# Patient Record
Sex: Female | Born: 1990 | Race: Black or African American | Hispanic: No | Marital: Single | Smoking: Never smoker
Health system: Southern US, Community
[De-identification: ages and names within clinical notes are randomized; demographics above are authoritative.]

## PROBLEM LIST (undated history)

## (undated) ENCOUNTER — Inpatient Hospital Stay (HOSPITAL_COMMUNITY): Payer: Self-pay

## (undated) DIAGNOSIS — S82899A Other fracture of unspecified lower leg, initial encounter for closed fracture: Secondary | ICD-10-CM

## (undated) DIAGNOSIS — B999 Unspecified infectious disease: Secondary | ICD-10-CM

## (undated) DIAGNOSIS — F419 Anxiety disorder, unspecified: Secondary | ICD-10-CM

## (undated) HISTORY — DX: Unspecified infectious disease: B99.9

## (undated) HISTORY — PX: NO PAST SURGERIES: SHX2092

## (undated) HISTORY — DX: Other fracture of unspecified lower leg, initial encounter for closed fracture: S82.899A

## (undated) HISTORY — DX: Anxiety disorder, unspecified: F41.9

---
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
## 2009-10-20 ENCOUNTER — Ambulatory Visit: Payer: Self-pay | Admitting: Internal Medicine

## 2009-10-20 DIAGNOSIS — R259 Unspecified abnormal involuntary movements: Secondary | ICD-10-CM | POA: Insufficient documentation

## 2009-10-20 DIAGNOSIS — F410 Panic disorder [episodic paroxysmal anxiety] without agoraphobia: Secondary | ICD-10-CM

## 2009-10-20 LAB — CONVERTED CEMR LAB: TSH: 1.164 microintl units/mL (ref 0.350–4.500)

## 2009-11-10 ENCOUNTER — Encounter (INDEPENDENT_AMBULATORY_CARE_PROVIDER_SITE_OTHER): Payer: Self-pay | Admitting: Internal Medicine

## 2010-01-19 ENCOUNTER — Ambulatory Visit: Admit: 2010-01-19 | Payer: Self-pay | Admitting: Internal Medicine

## 2010-02-13 NOTE — Assessment & Plan Note (Signed)
Summary: anxiety attacks,hands shake alot/lr   Vital Signs:  Patient profile:   20 year old female Height:      66 inches Weight:      168 pounds BMI:     27.21 Temp:     98.0 degrees F oral Pulse rate:   60 / minute Pulse rhythm:   regular Resp:     16 per minute BP sitting:   110 / 76  (left arm) Cuff size:   regular  Vitals Entered By: [place] Shannon (October 20, 2009 11:03 AM) CC: pt wants to know shoulders ach alot and she wants to [facility]... pt says her hands shake... Is Patient Diabetic? No Pain Assessment Patient in pain? no       Does patient need assistance? Functional Status Self care Ambulation Normal   CC:  pt wants to know shoulders ach alot and she wants to [facility]... pt says her hands shake....  History of Present Illness: 20 yo female here to establish.  1.  Shoulders ache--points to trap area.  Mainly when cooler temperatures or in air conditioning.  States has been a problem for 2 years.  Always a bit tight.  Posture sometimes not great, but generally not bad.  Generally, does not use a shoulder bag--quit some time ago as caused discomfort.  Has never had any treatment for this.  Feels she is stressed daily, so hard for her to say whether that makes worse.  Has not really taken anything otc for the discomfort.  Always feels stiff.  2.  Bilateral hand shaking:  problem since in Junior high.  Only when trying to do something.  No one else in family that she is aware of with same problem.  Cannot say whether when she was on the Fluoxetine that this symptoms was controlled.  3.  Infrequent Panic attacks:  was on Fluoxetine up until 2 years ago for that.   Stopped 2 years ago as ran out of Medicaid.  Has maybe 1-2 episodes monthly--was related more when on bus.  Usually able to self calm.    4.  HM:  had pap 3 months ago.  Received flu vaccine already today.  Current Medications (verified): 1)  Seasonique 0.15-0.03 &0.01 Mg Tabs (Levonorgest-Eth Estrad 91-Day)  .... One Pill Daily  Allergies (verified): No Known Drug Allergies  Family History: Mother, 49:  Healthy Father, died 33:  Anaphylaxis to bee sting. 5 Sisters:  healthy 1 Brother,:  healthy No children.  Social History: LIves with 2 roommates Junior in IT consultant at [employer] Tobacco Use:  Black and Mild on occasion. Alcohol:  occasional. Drug:  MJ--occasionally.  No history of any other drugs Boyfriend--sexually active--uses condoms and is on BCPs.  Physical Exam  General:  NAD Neck:  No deformities, masses, or tenderness noted.no thyromegaly.   Lungs:  Normal respiratory effort, chest expands symmetrically. Lungs are clear to auscultation, no crackles or wheezes.no wheezes.   Heart:  Normal rate and regular rhythm. S1 and S2 normal without gallop, murmur, click, rub or other extra sounds. Msk:  Tender over both trap heads bilaterally Neurologic:  Mild tremulousness with purposeful movement or when trying to hold hands steady.  Not at restgait normal and DTRs symmetrical and normal.     Impression & Recommendations:  Problem # 1:  MUSCLE SPASM, TRAPEZIUS, BILATERAL (ICD-728.85) Discussed warm packing and stretches. Orders: Physical Therapy Referral (PT)  Problem # 2:  TREMOR (ICD-781.0) Discussed beta blockers as possible treatment. Elects to just  follow for now--unlikely a concerning condition with chronicity and little worsening. Orders: T-TSH ([id number])  Problem # 3:  PANIC DISORDER (ICD-300.01) Able to control well currently without medication.   Hold on medication for now.  Complete Medication List: 1)  Seasonique 0.15-0.03 &0.01 Mg Tabs (Levonorgest-eth estrad 91-day) .... One pill daily  Other Orders: Flu Vaccine 61yrs + (14782) Admin 1st Vaccine (95621) Admin 1st Vaccine [facility]) [phone])  Patient Instructions: 1)  Follow up with Dr. Delrae Alfred in 3 months --trapezius pain 2)  Call if you do not hear from physical therapy in 2  weeks.     Influenza Vaccine    Vaccine Type: Fluvax 3+    Site: left deltoid    Mfr: GlaxoSmithKline    Dose: 0.5 ml    Route: IM    Given by: Armenia Shannon    Exp. Date: 06/2010    Lot #: [id number]    VIS given: 08/08/09 version given October 20, 2009.  Flu Vaccine Consent Questions    Do you have a history of severe allergic reactions to this vaccine? no    Any prior history of allergic reactions to egg and/or gelatin? no    Do you have a sensitivity to the preservative Thimersol? no    Do you have a past history of Guillan-Barre Syndrome? no    Do you currently have an acute febrile illness? no    Have you ever had a severe reaction to latex? no    Vaccine information given and explained to patient? yes    Are you currently pregnant? no

## 2010-02-13 NOTE — Letter (Signed)
Summary: *HSN Results Follow up  [facility]  [address] [place], [place] [address]   Phone: [phone]  Fax: [phone]      11/10/2009   Kathleen Sparks [address] [place], [place]  [address]   Dear  Ms. Kathleen Sparks,                            ____S.Drinkard,FNP   ____D. Gore,FNP       ____B. McPherson,MD   ____V. Rankins,MD    __X__E. Mulberry,MD    ____N. Daphine Deutscher, FNP  ____D. Reche Dixon, MD    ____K. Philipp Deputy, MD    ____Other     This letter is to inform you that your recent test(s):  _______Pap Smear    ____X___Lab Test     _______X-ray    __X_____ is within acceptable limits  _______ requires a medication change  _______ requires a follow-up lab visit  _______ requires a follow-up visit with your provider   Comments:  Thyroid testing was normal       _________________________________________________________ If you have any questions, please contact our office                     Sincerely,  Kathleen Manson MD [facility]

## 2010-09-20 ENCOUNTER — Emergency Department (HOSPITAL_COMMUNITY)
Admission: EM | Admit: 2010-09-20 | Discharge: 2010-09-20 | Disposition: A | Discharge status: Home / Self Care | Payer: No Typology Code available for payment source | Attending: Emergency Medicine | Admitting: Emergency Medicine

## 2010-09-20 DIAGNOSIS — R0989 Other specified symptoms and signs involving the circulatory and respiratory systems: Secondary | ICD-10-CM | POA: Insufficient documentation

## 2010-09-20 DIAGNOSIS — M25519 Pain in unspecified shoulder: Secondary | ICD-10-CM | POA: Insufficient documentation

## 2010-09-20 DIAGNOSIS — R0609 Other forms of dyspnea: Secondary | ICD-10-CM | POA: Insufficient documentation

## 2010-09-20 DIAGNOSIS — M542 Cervicalgia: Secondary | ICD-10-CM | POA: Insufficient documentation

## 2010-09-20 DIAGNOSIS — R0602 Shortness of breath: Secondary | ICD-10-CM | POA: Insufficient documentation

## 2012-01-15 NOTE — L&D Delivery Note (Signed)
Delivery Note At 10:29 PM a viable female was delivered via  (Presentation:LOA ).  Loose nuchal cord reduced right after delivery of head.  True knot noted after delivery.  APGAR: 9,9 ; weight pending.   Placenta status: Intact, Spontaneous.  Cord:  with the following complications: .  Cord pH: not collect  Anesthesia: Epidural  Episiotomy:  Lacerations: Periurethral, hemostatic, without repair. Suture Repair: n/a Est. Blood Loss (mL): 100  Mom to postpartum.  Baby to skin to skin immediately after delivery.  Haroldine Laws 09/16/2012, 10:46 PM

## 2012-01-22 ENCOUNTER — Ambulatory Visit: Payer: BC Managed Care – PPO | Admitting: Physician Assistant

## 2012-01-22 VITALS — BP 126/84 | HR 70 | Temp 98.7°F | Resp 16 | Ht 67.0 in | Wt 176.2 lb

## 2012-01-22 DIAGNOSIS — N912 Amenorrhea, unspecified: Secondary | ICD-10-CM

## 2012-01-22 DIAGNOSIS — Z3200 Encounter for pregnancy test, result unknown: Secondary | ICD-10-CM

## 2012-01-22 LAB — POCT URINE PREGNANCY: Preg Test, Ur: POSITIVE

## 2012-01-22 MED ORDER — PRENATAL VITAMINS 28-0.8 MG PO TABS: 1.0000 | ORAL_TABLET | Freq: Every day | ORAL | Status: DC

## 2012-01-22 NOTE — Progress Notes (Signed)
  Subjective:    Patient ID: Kathleen Sparks, female    DOB: [birth year], 22 y.o.   MRN: [id number]  HPI 22 year old female presents for pregnancy test. LNMP 12/12/11.  She has had abdominal cramping over the past few days, but denies bleeding/spotting, or pain.  She has not had any nausea or vomiting yet.  No current daily medications. She is otherwise healthy without any other complaints today.     Review of Systems  Constitutional: Negative for fever and chills.  Gastrointestinal: Positive for abdominal pain (cramping). Negative for nausea and vomiting.  Skin: Negative for rash.  Neurological: Negative for headaches.       Objective:   Physical Exam  Constitutional: She is oriented to person, place, and time. She appears well-developed and well-nourished.  HENT:  Head: Normocephalic and atraumatic.  Right Ear: External ear normal.  Eyes: Conjunctivae normal are normal.  Neck: Normal range of motion.  Cardiovascular: Normal rate, regular rhythm and normal heart sounds.   Pulmonary/Chest: Effort normal and breath sounds normal.  Abdominal: Soft. Bowel sounds are normal. There is no tenderness. There is no rebound and no guarding.  Neurological: She is alert and oriented to person, place, and time.  Psychiatric: She has a normal mood and affect. Her behavior is normal. Judgment and thought content normal.     Results for orders placed in visit on 01/22/12  POCT URINE PREGNANCY      Component Value Range   Preg Test, Ur Positive          Assessment & Plan:   1. Amenorrhea    2. Possible pregnancy  POCT urine pregnancy   Labs printed  Follow up with Obstetrics Start prenatal vitamins Continue folic acid Follow up as needed. RTC or go to ER with any worsening abdominal pain, cramping, or bleeding.

## 2012-01-22 NOTE — Patient Instructions (Addendum)
LNMP 12/12/11 - Estimated due date is 09/17/12

## 2012-03-06 ENCOUNTER — Ambulatory Visit: Payer: Medicaid Other | Admitting: Obstetrics and Gynecology

## 2012-03-06 DIAGNOSIS — Z331 Pregnant state, incidental: Secondary | ICD-10-CM

## 2012-03-06 DIAGNOSIS — Z36 Encounter for antenatal screening of mother: Secondary | ICD-10-CM

## 2012-03-06 LAB — POCT URINALYSIS DIPSTICK
Bilirubin, UA: NEGATIVE
Blood, UA: NEGATIVE
Glucose, UA: NEGATIVE
Ketones, UA: NEGATIVE
Nitrite, UA: NEGATIVE

## 2012-03-06 NOTE — Progress Notes (Signed)
NOB interview. NOB W/U with JO scheduled 03/13/12.  !st trimesters screen scheduled 03/16/12.

## 2012-03-07 LAB — CULTURE, OB URINE: Colony Count: 75000

## 2012-03-09 LAB — PRENATAL PANEL VII
Basophils Absolute: 0 10*3/uL (ref 0.0–0.1)
Basophils Relative: 0 % (ref 0–1)
Eosinophils Absolute: 0.1 10*3/uL (ref 0.0–0.7)
HIV: NONREACTIVE
Hemoglobin: 13.2 g/dL (ref 12.0–15.0)
Hepatitis B Surface Ag: NEGATIVE
MCH: 29.5 pg (ref 26.0–34.0)
MCHC: 34.4 g/dL (ref 30.0–36.0)
Monocytes Relative: 7 % (ref 3–12)
Neutro Abs: 9.6 10*3/uL — ABNORMAL HIGH (ref 1.7–7.7)
Neutrophils Relative %: 79 % — ABNORMAL HIGH (ref 43–77)
Platelets: 217 10*3/uL (ref 150–400)
RDW: 13.4 % (ref 11.5–15.5)

## 2012-03-09 LAB — GC/CHLAMYDIA PROBE AMP, URINE: GC Probe Amp, Urine: NEGATIVE

## 2012-03-10 LAB — HEMOGLOBINOPATHY EVALUATION
Hemoglobin Other: 0 %
Hgb A2 Quant: 2.9 % (ref 2.2–3.2)
Hgb A: 97.1 % (ref 96.8–97.8)
Hgb F Quant: 0 % (ref 0.0–2.0)

## 2012-03-13 ENCOUNTER — Encounter: Payer: Self-pay | Admitting: Certified Nurse Midwife

## 2012-03-13 ENCOUNTER — Ambulatory Visit: Payer: Medicaid Other | Admitting: Certified Nurse Midwife

## 2012-03-13 VITALS — BP 120/68 | Temp 98.5°F | Wt 172.0 lb

## 2012-03-13 DIAGNOSIS — Z331 Pregnant state, incidental: Secondary | ICD-10-CM

## 2012-03-13 MED ORDER — METRONIDAZOLE 0.75 % VA GEL: 1.0000 | Freq: Two times a day (BID) | VAGINAL | Status: DC

## 2012-03-13 NOTE — Progress Notes (Signed)
New ob work up No complaints LMP 12-12-2011 Last pap2012

## 2012-03-13 NOTE — Progress Notes (Signed)
   S NKDA  Due for Pap Smear  New OB WU  C/O vaginal d/c with intermittent pruritis O Thyroid - wnl, no masses or tenderness  Heart- RRR without Murmur  Lungs- C to A  Back- Nl curvature      No CVAT  Abd.: Soft and non tender            No RBT  Pelvic:Uterus enlarged to 13 wk size and slightly R/V   FHT's 134-150 and regular   Adnexa- non tender     Wet Prep:  Clue cells, Ph 5., Whiff pos  Plan:        metrogel                     Cont. PNV          Balanced diet                     Gc/chl and pap done  Plan:   having first tri screen March 2014   Pt. With supportive partner, present   Flagyl 500 mgs bid for BV   Balanced diet reviewed   Bleeding precautions reviewed   Call or follow up prn and for next [place] screen

## 2012-03-14 LAB — GC/CHLAMYDIA PROBE AMP
CT Probe RNA: NEGATIVE
GC Probe RNA: NEGATIVE

## 2012-03-15 LAB — URINE CULTURE

## 2012-03-16 ENCOUNTER — Ambulatory Visit: Payer: Medicaid Other

## 2012-03-16 ENCOUNTER — Other Ambulatory Visit: Payer: Self-pay

## 2012-03-16 DIAGNOSIS — Z36 Encounter for antenatal screening of mother: Secondary | ICD-10-CM

## 2012-03-16 LAB — US OB COMP LESS 14 WKS

## 2012-03-16 LAB — PAP IG W/ RFLX HPV ASCU

## 2012-04-15 ENCOUNTER — Encounter: Payer: Medicaid Other | Admitting: Obstetrics and Gynecology

## 2012-08-26 LAB — OB RESULTS CONSOLE GBS: GBS: POSITIVE

## 2012-09-16 ENCOUNTER — Encounter (HOSPITAL_COMMUNITY): Payer: Self-pay | Admitting: Anesthesiology

## 2012-09-16 ENCOUNTER — Inpatient Hospital Stay (HOSPITAL_COMMUNITY): Payer: Medicaid Other | Admitting: Anesthesiology

## 2012-09-16 ENCOUNTER — Inpatient Hospital Stay (HOSPITAL_COMMUNITY)
Admission: AD | Admit: 2012-09-16 | Discharge: 2012-09-18 | DRG: 774 | Disposition: A | Discharge status: Home / Self Care | Payer: Medicaid Other | Source: Ambulatory Visit | Attending: Obstetrics and Gynecology | Admitting: Obstetrics and Gynecology

## 2012-09-16 ENCOUNTER — Encounter (HOSPITAL_COMMUNITY): Payer: Self-pay | Admitting: *Deleted

## 2012-09-16 DIAGNOSIS — O99892 Other specified diseases and conditions complicating childbirth: Secondary | ICD-10-CM | POA: Diagnosis present

## 2012-09-16 DIAGNOSIS — A5619 Other chlamydial genitourinary infection: Secondary | ICD-10-CM | POA: Diagnosis present

## 2012-09-16 DIAGNOSIS — N739 Female pelvic inflammatory disease, unspecified: Secondary | ICD-10-CM | POA: Diagnosis present

## 2012-09-16 DIAGNOSIS — Z22330 Carrier of Group B streptococcus: Secondary | ICD-10-CM

## 2012-09-16 DIAGNOSIS — N390 Urinary tract infection, site not specified: Secondary | ICD-10-CM | POA: Diagnosis present

## 2012-09-16 DIAGNOSIS — O26849 Uterine size-date discrepancy, unspecified trimester: Secondary | ICD-10-CM | POA: Insufficient documentation

## 2012-09-16 DIAGNOSIS — O2390 Unspecified genitourinary tract infection in pregnancy, unspecified trimester: Secondary | ICD-10-CM | POA: Diagnosis present

## 2012-09-16 DIAGNOSIS — O98319 Other infections with a predominantly sexual mode of transmission complicating pregnancy, unspecified trimester: Secondary | ICD-10-CM | POA: Diagnosis present

## 2012-09-16 DIAGNOSIS — A749 Chlamydial infection, unspecified: Secondary | ICD-10-CM | POA: Insufficient documentation

## 2012-09-16 DIAGNOSIS — B3731 Acute candidiasis of vulva and vagina: Secondary | ICD-10-CM | POA: Insufficient documentation

## 2012-09-16 DIAGNOSIS — B373 Candidiasis of vulva and vagina: Secondary | ICD-10-CM | POA: Diagnosis present

## 2012-09-16 DIAGNOSIS — IMO0001 Reserved for inherently not codable concepts without codable children: Secondary | ICD-10-CM

## 2012-09-16 LAB — RPR: RPR Ser Ql: NONREACTIVE

## 2012-09-16 LAB — CBC
MCV: 88.4 fL (ref 78.0–100.0)
Platelets: 177 10*3/uL (ref 150–400)
RDW: 13.4 % (ref 11.5–15.5)
WBC: 14.6 10*3/uL — ABNORMAL HIGH (ref 4.0–10.5)

## 2012-09-16 MED ORDER — FENTANYL 2.5 MCG/ML BUPIVACAINE 1/10 % EPIDURAL INFUSION (WH - ANES)
14.0000 mL/h | INTRAMUSCULAR | Status: DC | PRN
  Administered 2012-09-16: 14 mL/h via EPIDURAL
  Filled 2012-09-16 (×2): qty 125

## 2012-09-16 MED ORDER — IBUPROFEN 600 MG PO TABS: 600.0000 mg | ORAL_TABLET | Freq: Four times a day (QID) | ORAL | Status: DC | PRN

## 2012-09-16 MED ORDER — ACETAMINOPHEN 325 MG PO TABS: 650.0000 mg | ORAL_TABLET | ORAL | Status: DC | PRN

## 2012-09-16 MED ORDER — EPHEDRINE 5 MG/ML INJ
10.0000 mg | INTRAVENOUS | Status: DC | PRN
  Filled 2012-09-16: qty 2

## 2012-09-16 MED ORDER — LIDOCAINE HCL (PF) 1 % IJ SOLN
INTRAMUSCULAR | Status: DC | PRN
  Administered 2012-09-16 (×2): 4 mL

## 2012-09-16 MED ORDER — DIPHENHYDRAMINE HCL 50 MG/ML IJ SOLN: 12.5000 mg | INTRAMUSCULAR | Status: DC | PRN

## 2012-09-16 MED ORDER — EPHEDRINE 5 MG/ML INJ
10.0000 mg | INTRAVENOUS | Status: DC | PRN
  Filled 2012-09-16: qty 4
  Filled 2012-09-16: qty 2

## 2012-09-16 MED ORDER — LACTATED RINGERS IV SOLN
INTRAVENOUS | Status: DC
  Administered 2012-09-16 (×2): via INTRAVENOUS

## 2012-09-16 MED ORDER — CITRIC ACID-SODIUM CITRATE 334-500 MG/5ML PO SOLN: 30.0000 mL | ORAL | Status: DC | PRN

## 2012-09-16 MED ORDER — LACTATED RINGERS IV SOLN: 500.0000 mL | INTRAVENOUS | Status: DC | PRN

## 2012-09-16 MED ORDER — DEXTROSE 5 % IV SOLN
5.0000 10*6.[IU] | Freq: Once | INTRAVENOUS | Status: AC
  Administered 2012-09-16: 5 10*6.[IU] via INTRAVENOUS
  Filled 2012-09-16: qty 5

## 2012-09-16 MED ORDER — OXYTOCIN BOLUS FROM INFUSION: 500.0000 mL | INTRAVENOUS | Status: DC

## 2012-09-16 MED ORDER — OXYTOCIN 40 UNITS IN LACTATED RINGERS INFUSION - SIMPLE MED
62.5000 mL/h | INTRAVENOUS | Status: DC
  Administered 2012-09-16: 62.5 mL/h via INTRAVENOUS
  Filled 2012-09-16: qty 1000

## 2012-09-16 MED ORDER — PENICILLIN G POTASSIUM 5000000 UNITS IJ SOLR
2.5000 10*6.[IU] | INTRAVENOUS | Status: DC
  Administered 2012-09-16 (×3): 2.5 10*6.[IU] via INTRAVENOUS
  Filled 2012-09-16 (×8): qty 2.5

## 2012-09-16 MED ORDER — OXYCODONE-ACETAMINOPHEN 5-325 MG PO TABS: 1.0000 | ORAL_TABLET | ORAL | Status: DC | PRN

## 2012-09-16 MED ORDER — PHENYLEPHRINE 40 MCG/ML (10ML) SYRINGE FOR IV PUSH (FOR BLOOD PRESSURE SUPPORT)
80.0000 ug | PREFILLED_SYRINGE | INTRAVENOUS | Status: DC | PRN
  Filled 2012-09-16: qty 2
  Filled 2012-09-16: qty 5

## 2012-09-16 MED ORDER — ONDANSETRON HCL 4 MG/2ML IJ SOLN: 4.0000 mg | Freq: Four times a day (QID) | INTRAMUSCULAR | Status: DC | PRN

## 2012-09-16 MED ORDER — LACTATED RINGERS IV SOLN
500.0000 mL | Freq: Once | INTRAVENOUS | Status: AC
  Administered 2012-09-16: 500 mL via INTRAVENOUS

## 2012-09-16 MED ORDER — LIDOCAINE HCL (PF) 1 % IJ SOLN
30.0000 mL | INTRAMUSCULAR | Status: DC | PRN
  Filled 2012-09-16 (×2): qty 30

## 2012-09-16 MED ORDER — FENTANYL 2.5 MCG/ML BUPIVACAINE 1/10 % EPIDURAL INFUSION (WH - ANES)
INTRAMUSCULAR | Status: DC | PRN
  Administered 2012-09-16: 14 mL/h via EPIDURAL

## 2012-09-16 MED ORDER — PHENYLEPHRINE 40 MCG/ML (10ML) SYRINGE FOR IV PUSH (FOR BLOOD PRESSURE SUPPORT)
80.0000 ug | PREFILLED_SYRINGE | INTRAVENOUS | Status: DC | PRN
  Filled 2012-09-16: qty 2

## 2012-09-16 NOTE — Progress Notes (Signed)
  Subjective: Pt resting comfortably watching tv.  Pt reports feeling intermittent rectal pressure.  Objective: BP 109/92  Pulse 86  Temp(Src) 98.6 F (37 C) (Oral)  Resp 18  Ht 5\' 6"  (1.676 m)  Wt 200 lb 9.6 oz (90.992 kg)  BMI 32.39 kg/m2  SpO2 100%  LMP 12/12/2011 I/O last 3 completed shifts: In: -  Out: 1200 [Urine:1200]    FHT:  FHR: 145 bpm, variability: moderate,  accelerations:  Present,  decelerations:  Present occasional variables UC:   regular, every 2-4 minutes  SVE:   Deferred  Assessment / Plan:  Labor: Active labor; Progressing normally  Preeclampsia: no signs or symptoms of toxicity  Fetal Wellbeing: Category I  Pain Control: Epidural  I/D: GBS pos; PCN per protocol; AROM at 1130; afibrile Anticipated MOD: NSVD     Lou Irigoyen 09/16/2012, 8:06 PM

## 2012-09-16 NOTE — H&P (Signed)
Kathleen Sparks is a 22 y.o. female, G1P0000 at [redacted]w[redacted]d, presenting for active labor.  UCs since 2 am.  Denies VB, LOF, recent fever, resp or GI c/o's, UTI or PIH s/s. GFM. Desires epidural.  Patient Active Problem List   Diagnosis Date Noted  . Chlamydia infection 09/16/2012  . Candidal vaginitis 09/16/2012  . UTI (urinary tract infection) 09/16/2012  . Uterine size date discrepancy 09/16/2012  . PANIC DISORDER 10/20/2009  . TREMOR 10/20/2009    History of present pregnancy: Patient entered care at 12 weeks.   EDC of 09/17/12 was established by LMP.   Anatomy scan:  19 weeks, limited with normal findings and an anterior placenta.   Additional [place] evaluations:  [redacted]w[redacted]d Anatomy f/u - anatomy completed, AFI normal, EFW 29th%ile.  [redacted]w[redacted]d for growth and BPP - EFW 11th%ile, 8/8, fluid normal Significant prenatal events:  none   Last evaluation:  09/15/12 at [redacted]w[redacted]d no cervical exam  OB History   Grav Para Term Preterm Abortions TAB SAB Ect Mult Living   1 0 0 0 0 0 0 0 0 0      Past Medical History  Diagnosis Date  . Anxiety 2008    SHORT TERM MED  . Infection 2009    UTI  . Infection 2011    CHLAMYDIA  . Ankle fracture 2001   No past surgical history on file. Family History: family history includes Alcohol abuse in her maternal uncle; Anemia in her mother; Arthritis in her paternal grandmother; Asthma in her other; Cancer in her maternal uncle; Depression in her mother; Early death (age of onset: 64) in her father; Hypertension in her mother; Learning disabilities in her maternal uncle. Social History:  reports that she has never smoked. She has never used smokeless tobacco. She reports that  drinks alcohol. She reports that she uses illicit drugs.   Prenatal Transfer Tool  Maternal Diabetes: No Genetic Screening: Normal Maternal Ultrasounds/Referrals: Normal Fetal Ultrasounds or other Referrals:  None Maternal Substance Abuse:  No Significant Maternal Medications:  None Significant  Maternal Lab Results: Lab values include: Group B Strep positive    ROS: see HPI above, all other systems are negative  No Known Allergies   Dilation: 4 Effacement (%): 90 Station: -2 Exam by:: D Nelson RN Blood pressure 128/84, pulse 87, temperature 98.4 F (36.9 C), temperature source Oral, resp. rate 20, height 5\' 6"  (1.676 m), weight 200 lb 9.6 oz (90.992 kg), last menstrual period 12/12/2011.  Chest clear Heart RRR without murmur Abd gravid, NT Ext: WNL  FHR: Reactive NST UCs:  Q 1.5 - 3 min  Prenatal labs: ABO, Rh: O/POS/-- (02/21 1136) Antibody: NEG (02/21 1136) Rubella:   Immune RPR: NON REAC (02/21 1136)  HBsAg: NEGATIVE (02/21 1136)  HIV: NON REACTIVE (02/21 1136)  GBS:  Pos Sickle cell/Hgb electrophoresis:  Normal study Pap:  03/13/12 WNL GC:  Neg Chlamydia:  Neg Genetic screenings:  1st trimester screen - normal Glucola:  69 Other:  n/a   Assessment/Plan: IUP at [redacted]w[redacted]d Active labor GBS pos Desires epidural  Admit to BS per c/w Dr. Richardson Dopp as attending MD Routine [facility] admission orders GBS prophylaxis PCN per protocol Epidural prn   Rowan Blase, MSN 09/16/2012, 6:13 AM

## 2012-09-16 NOTE — Progress Notes (Signed)
To room 166

## 2012-09-16 NOTE — Progress Notes (Signed)
Kathleen Sparks is a 22 y.o. G1P0000 at [redacted]w[redacted]d by LMP admitted for active labor  Subjective:   Objective: BP 114/63  Pulse 78  Temp(Src) 98.8 F (37.1 C) (Axillary)  Resp 18  Ht 5\' 6"  (1.676 m)  Wt 200 lb 9.6 oz (90.992 kg)  BMI 32.39 kg/m2  SpO2 100%  LMP 12/12/2011      FHT:  FHR: 150 bpm, variability: marked,  accelerations:  Present,  decelerations:  Present occ variable to 100s for 10-20 seconds UC:   regular, every 2-4 minutes SVE:   Dilation: 4.5 Effacement (%): 90 Station: -1 Exam by:: Adolphe Fortunato, MD  Labs: Lab Results  Component Value Date   WBC 14.6* 09/16/2012   HGB 12.8 09/16/2012   HCT 37.2 09/16/2012   MCV 88.4 09/16/2012   PLT 177 09/16/2012    Assessment / Plan: no cervical change after 2 hrs.  AROM clear fluid.  If no cervical chge in 2 hrs plan pitocin  Labor: Progressing normally Preeclampsia:  no signs or symptoms of toxicity Fetal Wellbeing:  Category I Pain Control:  Epidural I/D:  n/a Anticipated MOD:  NSVD PCN for GBS  Kathleen Sparks A 09/16/2012, 1:07 PM

## 2012-09-16 NOTE — MAU Note (Signed)
Patient states she starting have uc's at around 2 AM. Denies LOF, bleeding, or discharge.

## 2012-09-16 NOTE — Anesthesia Procedure Notes (Signed)
Epidural Patient location during procedure: OB Start time: 09/16/2012 8:15 AM  Staffing Anesthesiologist: Kule Gascoigne A. Performed by: anesthesiologist   Preanesthetic Checklist Completed: patient identified, site marked, surgical consent, pre-op evaluation, timeout performed, IV checked, risks and benefits discussed and monitors and equipment checked  Epidural Patient position: sitting Prep: site prepped and draped and DuraPrep Patient monitoring: continuous pulse ox and blood pressure Approach: midline Injection technique: LOR air  Needle:  Needle type: Tuohy  Needle gauge: 17 G Needle length: 9 cm and 9 Needle insertion depth: 6 cm Catheter type: closed end flexible Catheter size: 19 Gauge Catheter at skin depth: 11 cm Test dose: negative and Other  Assessment Events: blood not aspirated, injection not painful, no injection resistance, negative IV test and no paresthesia  Additional Notes Patient identified. Risks and benefits discussed including failed block, incomplete  Pain control, post dural puncture headache, nerve damage, paralysis, blood pressure Changes, nausea, vomiting, reactions to medications-both toxic and allergic and post Partum back pain. All questions were answered. Patient expressed understanding and wished to proceed. Sterile technique was used throughout procedure. Epidural site was Dressed with sterile barrier dressing. No paresthesias, signs of intravascular injection Or signs of intrathecal spread were encountered.  Patient was more comfortable after the epidural was dosed. Please see RN's note for documentation of vital signs and FHR which are stable.

## 2012-09-16 NOTE — Anesthesia Preprocedure Evaluation (Signed)
Anesthesia Evaluation  Patient identified by MRN, date of birth, ID band Patient awake    Reviewed: Allergy & Precautions, H&P , Patient's Chart, lab work & pertinent test results  Airway Mallampati: II TM Distance: >3 FB Neck ROM: Full    Dental no notable dental hx. (+) Teeth Intact   Pulmonary neg pulmonary ROS,  breath sounds clear to auscultation  Pulmonary exam normal       Cardiovascular negative cardio ROS  Rhythm:Regular Rate:Normal     Neuro/Psych PSYCHIATRIC DISORDERS Anxiety Panic Attacksnegative neurological ROS     GI/Hepatic negative GI ROS, Neg liver ROS,   Endo/Other  negative endocrine ROS  Renal/GU negative Renal ROS  negative genitourinary   Musculoskeletal negative musculoskeletal ROS (+)   Abdominal (+) + obese,   Peds  Hematology negative hematology ROS (+)   Anesthesia Other Findings   Reproductive/Obstetrics (+) Pregnancy                           Anesthesia Physical Anesthesia Plan  ASA: II  Anesthesia Plan: Epidural   Post-op Pain Management:    Induction:   Airway Management Planned: Natural Airway  Additional Equipment:   Intra-op Plan:   Post-operative Plan:   Informed Consent: I have reviewed the patients History and Physical, chart, labs and discussed the procedure including the risks, benefits and alternatives for the proposed anesthesia with the patient or authorized representative who has indicated his/her understanding and acceptance.   Dental advisory given  Plan Discussed with: Anesthesiologist  Anesthesia Plan Comments:         Anesthesia Hausner Evaluation

## 2012-09-17 LAB — CBC
HCT: 35.6 % — ABNORMAL LOW (ref 36.0–46.0)
Hemoglobin: 12 g/dL (ref 12.0–15.0)
MCHC: 33.7 g/dL (ref 30.0–36.0)
RBC: 4.02 MIL/uL (ref 3.87–5.11)
WBC: 17.1 10*3/uL — ABNORMAL HIGH (ref 4.0–10.5)

## 2012-09-17 MED ORDER — SIMETHICONE 80 MG PO CHEW: 80.0000 mg | CHEWABLE_TABLET | ORAL | Status: DC | PRN

## 2012-09-17 MED ORDER — ONDANSETRON HCL 4 MG PO TABS: 4.0000 mg | ORAL_TABLET | ORAL | Status: DC | PRN

## 2012-09-17 MED ORDER — BENZOCAINE-MENTHOL 20-0.5 % EX AERO: 1.0000 "application " | INHALATION_SPRAY | CUTANEOUS | Status: DC | PRN

## 2012-09-17 MED ORDER — ONDANSETRON HCL 4 MG/2ML IJ SOLN: 4.0000 mg | INTRAMUSCULAR | Status: DC | PRN

## 2012-09-17 MED ORDER — TETANUS-DIPHTH-ACELL PERTUSSIS 5-2.5-18.5 LF-MCG/0.5 IM SUSP
0.5000 mL | Freq: Once | INTRAMUSCULAR | Status: AC
  Administered 2012-09-17: 0.5 mL via INTRAMUSCULAR
  Filled 2012-09-17: qty 0.5

## 2012-09-17 MED ORDER — PRENATAL MULTIVITAMIN CH
1.0000 | ORAL_TABLET | Freq: Every day | ORAL | Status: DC
  Administered 2012-09-17: 1 via ORAL
  Filled 2012-09-17: qty 1

## 2012-09-17 MED ORDER — DIPHENHYDRAMINE HCL 25 MG PO CAPS: 25.0000 mg | ORAL_CAPSULE | Freq: Four times a day (QID) | ORAL | Status: DC | PRN

## 2012-09-17 MED ORDER — DIBUCAINE 1 % RE OINT: 1.0000 "application " | TOPICAL_OINTMENT | RECTAL | Status: DC | PRN

## 2012-09-17 MED ORDER — SENNOSIDES-DOCUSATE SODIUM 8.6-50 MG PO TABS
2.0000 | ORAL_TABLET | Freq: Every day | ORAL | Status: DC
  Administered 2012-09-17: 2 via ORAL

## 2012-09-17 MED ORDER — WITCH HAZEL-GLYCERIN EX PADS: 1.0000 "application " | MEDICATED_PAD | CUTANEOUS | Status: DC | PRN

## 2012-09-17 MED ORDER — ZOLPIDEM TARTRATE 5 MG PO TABS: 5.0000 mg | ORAL_TABLET | Freq: Every evening | ORAL | Status: DC | PRN

## 2012-09-17 MED ORDER — OXYCODONE-ACETAMINOPHEN 5-325 MG PO TABS
1.0000 | ORAL_TABLET | ORAL | Status: DC | PRN
  Administered 2012-09-17 – 2012-09-18 (×4): 1 via ORAL
  Filled 2012-09-17 (×4): qty 1

## 2012-09-17 MED ORDER — IBUPROFEN 600 MG PO TABS
600.0000 mg | ORAL_TABLET | Freq: Four times a day (QID) | ORAL | Status: DC
  Administered 2012-09-17 – 2012-09-18 (×6): 600 mg via ORAL
  Filled 2012-09-17 (×8): qty 1

## 2012-09-17 MED ORDER — LANOLIN HYDROUS EX OINT: TOPICAL_OINTMENT | CUTANEOUS | Status: DC | PRN

## 2012-09-17 NOTE — Anesthesia Postprocedure Evaluation (Signed)
Anesthesia Post Note  Patient: Kathleen Sparks  Procedure(s) Performed: * No procedures listed *  Anesthesia type: Epidural  Patient location: Mother/Baby  Post pain: Pain level controlled  Post assessment: Post-op Vital signs reviewed  Last Vitals:  Filed Vitals:   09/17/12 0701  BP: 122/65  Pulse: 76  Temp: 36.8 C  Resp: 18    Post vital signs: Reviewed  Level of consciousness: awake  Complications: No apparent anesthesia complications

## 2012-09-17 NOTE — Progress Notes (Signed)
Post Partum Day 1 Subjective: no complaints, up ad lib, voiding and tolerating PO  Objective: Blood pressure 122/65, pulse 76, temperature 98.3 F (36.8 C), temperature source Oral, resp. rate 18, height 5\' 6"  (1.676 m), weight 200 lb 9.6 oz (90.992 kg), last menstrual period 12/12/2011, SpO2 100.00%, unknown if currently breastfeeding.  Physical Exam:  General: alert and cooperative Lochia: appropriate Uterine Fundus: firm Incision: na DVT Evaluation: No evidence of DVT seen on physical exam.   Recent Labs  09/16/12 0615 09/17/12 0605  HGB 12.8 12.0  HCT 37.2 35.6*    Assessment/Plan: Plan for discharge tomorrow   LOS: 1 day   Roselina Burgueno A 09/17/2012, 11:33 AM

## 2012-09-18 MED ORDER — IBUPROFEN 600 MG PO TABS: 600.0000 mg | ORAL_TABLET | Freq: Four times a day (QID) | ORAL | Status: DC

## 2012-09-18 MED ORDER — OXYCODONE-ACETAMINOPHEN 5-325 MG PO TABS: 1.0000 | ORAL_TABLET | ORAL | Status: DC | PRN

## 2012-09-18 MED ORDER — MEDROXYPROGESTERONE ACETATE 150 MG/ML IM SUSP
150.0000 mg | Freq: Once | INTRAMUSCULAR | Status: AC
  Administered 2012-09-18: 150 mg via INTRAMUSCULAR
  Filled 2012-09-18: qty 1

## 2012-09-18 NOTE — Lactation Note (Signed)
This note was copied from the chart of Kathleen Sparks. Lactation Consultation Note  Baby has been breastfeeding using a nipple shield. RN assisted mother with latching without the shield and baby is latched and feeding well on this visit. Mother's breast are filling and colostrum is easily expressed. Mother is very motivated and making great progression. Ped appointment on 09/19/12. Informed of lactation services and support groups.   Patient Name: Kathleen Sparks [id number] Date: 09/18/2012 Reason for consult: Follow-up assessment   Maternal Data Has patient been taught Hand Expression?: Yes  Feeding Feeding Type: Breast Milk Length of feed: 30 min  LATCH Score/Interventions Latch: Grasps breast easily, tongue down, lips flanged, rhythmical sucking.  Audible Swallowing: A few with stimulation Intervention(s): Skin to skin Intervention(s): Hand expression  Type of Nipple: Everted at rest and after stimulation Intervention(s):  (nipple shield)  Comfort (Breast/Nipple): Soft / non-tender     Hold (Positioning): No assistance needed to correctly position infant at breast. Intervention(s): Breastfeeding basics reviewed  LATCH Score: 9  Lactation Tools Discussed/Used     Consult Status Consult Status: Complete    Omar Person 09/18/2012, 11:42 AM

## 2012-09-18 NOTE — Discharge Summary (Signed)
   Obstetric Discharge Summary Reason for Admission: onset of labor Prenatal Procedures: none Intrapartum Procedures: GBS prophylaxis Postpartum Procedures: none Complications-Operative and Postpartum: none  Temp:  [97.8 F (36.6 C)-98.4 F (36.9 C)] 98.4 F (36.9 C) (09/05 0505) Pulse Rate:  [76-79] 76 (09/05 0505) Resp:  [18] 18 (09/05 0505) BP: (111-114)/(68-77) 111/77 mmHg (09/05 0505) Hemoglobin  Date Value Range Status  09/17/2012 12.0  12.0 - 15.0 g/dL Final     HCT  Date Value Range Status  09/17/2012 35.6* 36.0 - 46.0 % Final    Hospital Course:  Hospital Course: Admitted in labor . Positive  GBS.  Delivery was performed by Haroldine Laws without difficulty. Patient and baby tolerated the procedure without difficulty, with a periurethral laceration noted. Infant to FTN. Mother and infant then had an uncomplicated postpartum course, with breast feeding going well. Mom's physical exam was WNL, and she was discharged home in stable condition. Contraception plan was Depo Provera at discharge with final decision to be made within 3 months.  She received adequate benefit from po pain medications.  Discharge Diagnoses: Term Pregnancy-delivered  Discharge Information: Date: 09/18/2012 Activity: pelvic rest Diet: routine Medications:    Medication List         ibuprofen 600 MG tablet  Commonly known as:  ADVIL,MOTRIN  Take 1 tablet (600 mg total) by mouth every 6 (six) hours.     oxyCODONE-acetaminophen 5-325 MG per tablet  Commonly known as:  PERCOCET/ROXICET  Take 1-2 tablets by mouth every 4 (four) hours as needed.     prenatal multivitamin Tabs tablet  Take 1 tablet by mouth daily at 12 noon.       Condition: stable and improved Instructions: refer to practice specific booklet Discharge to: home     Follow-up Information   Follow up with [facility]. Call in 6 weeks.   Specialty:  Obstetrics and Gynecology   Contact information:   [address] [place] [address] [phone]      Newborn Data: Live born  Information for the patient's newborn:  Suprina, [place] [[id number]]  female ;9 APGAR ,9 ; weight ; 6lbs 1 oz Home with mother. Plans outpatient circ  Lorik Guo P 09/18/2012, 11:48 AM

## 2012-09-18 NOTE — Clinical Social Work Maternal (Signed)
    Clinical Social Work Department PSYCHOSOCIAL ASSESSMENT - MATERNAL/CHILD 09/18/2012  Patient:  Kathleen Sparks, Kathleen Sparks  Account Number:  [id number]  Admit Date:  09/16/2012  Kathleen Sparks    Clinical Social Worker:  Nobie Putnam, LCSW   Date/Time:  09/17/2012 01:00 PM  Date Referred:  09/17/2012   Referral source  CN     Referred reason  Substance Abuse  Depression/Anxiety   Other referral source:    I:  FAMILY / HOME ENVIRONMENT Child's legal guardian:  PARENT  Guardian - Name Guardian - Age Guardian - Address  Kathleen Sparks [address] [place], [place] [address]  Kathleen Sparks 20 (same as above)   Other household support members/support persons Other support:   Kathleen Sparks, pt's mom    II  PSYCHOSOCIAL DATA Information Source:  Patient Interview  Event organiser Employment:   Surveyor, quantity resources:  [employer] If Medicaid - County:  [employer] Other  Sales executive  WIC   School / Grade:   Maternity Care Coordinator / Child Services Coordination / Early Interventions:  Cultural issues impacting care:    III  STRENGTHS Strengths  Adequate Resources  Home prepared for Child (including basic supplies)  Supportive family/friends   Strength comment:    IV  RISK FACTORS AND CURRENT PROBLEMS Current Problem:  YES   Risk Factor & Current Problem Patient Issue Family Issue Risk Factor / Current Problem Comment  Substance Abuse Y N Hx of Etoh & MJ use  Mental Illness Y N Hx of panic attacks    V  SOCIAL WORK ASSESSMENT CSW referral received to assess pt's history of substance use & panic attacks, noted in pt's chart.  Pt admits to smoking MJ & drinking alcohol "occasionally," prior to pregnancy confirmation at 2 months.  Once pt's pregnancy was confirmed, she stopped all substance use.  She denies other illegal substance use.  CSW explained hospital drug testing policy & pt verbalized understanding.  UDS is negative, meconium  results are pending.  Pt's history of panic attacks were an issues years ago & is no longer an issue.  She denies any history of depression.  History of SI however none since 22 years old.  She has all the necessary supplies for the infant & good family support. FOB was at the bedside & appears supportive.  CSW will continue to monitor drug screen results & make a referral if needed.      VI SOCIAL WORK PLAN Social Work Plan  No Further Intervention Required / No Barriers to Discharge   Type of pt/family education:   If child protective services report - county:   If child protective services report - date:   Information/referral to community resources comment:   Other social work plan:

## 2013-11-15 ENCOUNTER — Encounter (HOSPITAL_COMMUNITY): Payer: Self-pay | Admitting: *Deleted

## 2016-02-05 ENCOUNTER — Emergency Department (HOSPITAL_BASED_OUTPATIENT_CLINIC_OR_DEPARTMENT_OTHER)
Admission: EM | Admit: 2016-02-05 | Discharge: 2016-02-05 | Disposition: A | Discharge status: Home / Self Care | Payer: Worker's Compensation | Attending: Emergency Medicine | Admitting: Emergency Medicine

## 2016-02-05 ENCOUNTER — Encounter (HOSPITAL_BASED_OUTPATIENT_CLINIC_OR_DEPARTMENT_OTHER): Payer: Self-pay | Admitting: *Deleted

## 2016-02-05 DIAGNOSIS — W290XXA Contact with powered kitchen appliance, initial encounter: Secondary | ICD-10-CM | POA: Diagnosis not present

## 2016-02-05 DIAGNOSIS — Y9289 Other specified places as the place of occurrence of the external cause: Secondary | ICD-10-CM | POA: Insufficient documentation

## 2016-02-05 DIAGNOSIS — S61214A Laceration without foreign body of right ring finger without damage to nail, initial encounter: Secondary | ICD-10-CM | POA: Diagnosis present

## 2016-02-05 DIAGNOSIS — Y9389 Activity, other specified: Secondary | ICD-10-CM | POA: Diagnosis not present

## 2016-02-05 DIAGNOSIS — Y990 Civilian activity done for income or pay: Secondary | ICD-10-CM | POA: Insufficient documentation

## 2016-02-05 DIAGNOSIS — F1290 Cannabis use, unspecified, uncomplicated: Secondary | ICD-10-CM | POA: Insufficient documentation

## 2016-02-05 DIAGNOSIS — Z79899 Other long term (current) drug therapy: Secondary | ICD-10-CM | POA: Diagnosis not present

## 2016-02-05 MED ORDER — LIDOCAINE HCL (PF) 1 % IJ SOLN
5.0000 mL | Freq: Once | INTRAMUSCULAR | Status: AC
  Administered 2016-02-05: 5 mL
  Filled 2016-02-05: qty 5

## 2016-02-05 NOTE — ED Notes (Signed)
ED Provider at bedside. 

## 2016-02-05 NOTE — ED Triage Notes (Signed)
Pt works for [employer]. Pt cut her finger while cleaning slicer at work. Supervisor with pt states she does need a UDS

## 2016-02-05 NOTE — ED Provider Notes (Signed)
[facility] Provider Note   CSN: [id number] Arrival date & time: 02/05/16  1031     History   Chief Complaint Chief Complaint  Patient presents with  . Laceration    HPI Kathleen Sparks is a 26 y.o. female.  HPI Patient with right fourth finger laceration. It got caught on a slicer at [facility]. Tetanus is up-to-date. Just pain at the site no other injury. She is otherwise healthy. Denies possibility of pregnancy.   Past Medical History:  Diagnosis Date  . Ankle fracture 2001  . Anxiety 2008   SHORT TERM MED  . Infection 2009   UTI  . Infection 2011   CHLAMYDIA    Patient Active Problem List   Diagnosis Date Noted  . Chlamydia infection 09/16/2012  . Candidal vaginitis 09/16/2012  . UTI (urinary tract infection) 09/16/2012  . Uterine size date discrepancy 09/16/2012  . PANIC DISORDER 10/20/2009  . TREMOR 10/20/2009    History reviewed. No pertinent surgical history.  OB History    Gravida Para Term Preterm AB Living   1 1 1  0 0 1   SAB TAB Ectopic Multiple Live Births   0 0 0 0 1       Home Medications    Prior to Admission medications   Medication Sig Start Date End Date Taking? Authorizing Provider  ibuprofen (ADVIL,MOTRIN) 600 MG tablet Take 1 tablet (600 mg total) by mouth every 6 (six) hours. 09/18/12   Hal Morales, MD  oxyCODONE-acetaminophen (PERCOCET/ROXICET) 5-325 MG per tablet Take 1-2 tablets by mouth every 4 (four) hours as needed. 09/18/12   Hal Morales, MD  Prenatal Vit-Fe Fumarate-FA (PRENATAL MULTIVITAMIN) TABS tablet Take 1 tablet by mouth daily at 12 noon.    Historical Provider, MD    Family History Family History  Problem Relation Age of Onset  . Depression Mother   . Hypertension Mother   . Anemia Mother   . Early death Father 14    ALLERGIC REACTION TO BEE STING  . Arthritis Paternal Grandmother   . Asthma Other   . Cancer Maternal Uncle     LIVER;COLON  . Learning disabilities Maternal Uncle   .  Alcohol abuse Maternal Uncle     Social History Social History  Substance Use Topics  . Smoking status: Never Smoker  . Smokeless tobacco: Never Used  . Alcohol use Yes     Comment: OCC; D/C'D 12/2011     Allergies   Patient has no known allergies.   Review of Systems Review of Systems  Constitutional: Negative for appetite change.  Skin: Positive for wound.  Neurological: Negative for weakness and numbness.     Physical Exam Updated Vital Signs BP 134/96 (BP Location: Left Arm)   Pulse 73   Temp 98.4 F (36.9 C) (Oral)   Resp 18   Ht 5\' 7"  (1.702 m)   Wt 165 lb (74.8 kg)   LMP 02/03/2016   SpO2 100%   BMI 25.84 kg/m   Physical Exam  Constitutional: She appears well-developed.  HENT:  Head: Atraumatic.  Musculoskeletal:  Flap laceration along the pulmonary aspect of the distal phalanx of the fourth finger of the right hand. It is approximately 2 cm long. No bone visualized. Good flexion extension at the IP joint. Good capillary refill in the finger and the flap.     ED Treatments / Results  Labs (all labs ordered are listed, but only abnormal results are displayed) Labs Reviewed - No  data to display  EKG  EKG Interpretation None       Radiology No results found.  Procedures Procedures (including critical care time)  Medications Ordered in ED Medications  lidocaine (PF) (XYLOCAINE) 1 % injection 5 mL (5 mLs Infiltration Given by Other 02/05/16 1218)     Initial Impression / Assessment and Plan / ED Course  I have reviewed the triage vital signs and the nursing notes.  Pertinent labs & imaging results that were available during my care of the patient were reviewed by me and considered in my medical decision making (see chart for details).   LACERATION REPAIR Performed by: Billee CashingPICKERING,Demarious Kapur R. Authorized by: Billee CashingPICKERING,Gene Colee R. Consent: Verbal consent obtained. Risks and benefits: risks, benefits and alternatives were discussed Consent  given by: patient Patient identity confirmed: provided demographic data Prepped and Draped in normal sterile fashion Wound explored  Laceration Location: finger  Laceration Length: 2cm  No Foreign Bodies seen or palpated  Anesthesia:digital block  Local anesthetic: lidocaine 1  Anesthetic total: 3ml  Irrigation method: syringe Amount of cleaning: standard  Skin closure: vicryl rapide 5-0  Number of sutures: 10  Technique: simple interupted  Patient tolerance: Patient tolerated the procedure well with no immediate complications.    Patient with finger laceration. No apparent bony involvement. Sutured. Tetanus up-to-date. Discharge home.  Final Clinical Impressions(s) / ED Diagnoses   Final diagnoses:  Laceration of right ring finger without foreign body without damage to nail, initial encounter    New Prescriptions New Prescriptions   No medications on file     Benjiman CoreNathan Tameron Lama, MD 02/05/16 1300

## 2016-02-05 NOTE — Discharge Instructions (Signed)
Stitches out in 7-10 days

## 2017-10-27 ENCOUNTER — Telehealth: Payer: Self-pay | Admitting: *Deleted

## 2017-10-27 ENCOUNTER — Inpatient Hospital Stay (HOSPITAL_COMMUNITY)
Admission: AD | Admit: 2017-10-27 | Discharge: 2017-10-27 | Disposition: A | Discharge status: Home / Self Care | Payer: Medicaid Other | Source: Ambulatory Visit | Attending: Obstetrics and Gynecology | Admitting: Obstetrics and Gynecology

## 2017-10-27 ENCOUNTER — Encounter (HOSPITAL_COMMUNITY): Payer: Self-pay | Admitting: *Deleted

## 2017-10-27 DIAGNOSIS — O210 Mild hyperemesis gravidarum: Secondary | ICD-10-CM | POA: Diagnosis not present

## 2017-10-27 DIAGNOSIS — O219 Vomiting of pregnancy, unspecified: Secondary | ICD-10-CM | POA: Diagnosis not present

## 2017-10-27 DIAGNOSIS — O26892 Other specified pregnancy related conditions, second trimester: Secondary | ICD-10-CM | POA: Diagnosis not present

## 2017-10-27 DIAGNOSIS — N898 Other specified noninflammatory disorders of vagina: Secondary | ICD-10-CM | POA: Diagnosis not present

## 2017-10-27 DIAGNOSIS — Z3A18 18 weeks gestation of pregnancy: Secondary | ICD-10-CM | POA: Insufficient documentation

## 2017-10-27 DIAGNOSIS — B9689 Other specified bacterial agents as the cause of diseases classified elsewhere: Secondary | ICD-10-CM | POA: Insufficient documentation

## 2017-10-27 DIAGNOSIS — O23592 Infection of other part of genital tract in pregnancy, second trimester: Secondary | ICD-10-CM | POA: Insufficient documentation

## 2017-10-27 DIAGNOSIS — N760 Acute vaginitis: Secondary | ICD-10-CM

## 2017-10-27 LAB — URINALYSIS, ROUTINE W REFLEX MICROSCOPIC
Bilirubin Urine: NEGATIVE
Glucose, UA: NEGATIVE mg/dL
Hgb urine dipstick: NEGATIVE
Ketones, ur: NEGATIVE mg/dL
Leukocytes, UA: NEGATIVE
Nitrite: NEGATIVE
Protein, ur: NEGATIVE mg/dL
Specific Gravity, Urine: 1.015 (ref 1.005–1.030)
pH: 7 (ref 5.0–8.0)

## 2017-10-27 LAB — WET PREP, GENITAL
Sperm: NONE SEEN
Trich, Wet Prep: NONE SEEN
Yeast Wet Prep HPF POC: NONE SEEN

## 2017-10-27 MED ORDER — METRONIDAZOLE 500 MG PO TABS: 500.0000 mg | ORAL_TABLET | Freq: Two times a day (BID) | ORAL | 0 refills | Status: DC

## 2017-10-27 MED ORDER — ONDANSETRON 4 MG PO TBDP: 4.0000 mg | ORAL_TABLET | Freq: Three times a day (TID) | ORAL | 1 refills | Status: DC | PRN

## 2017-10-27 MED ORDER — PREPLUS 27-1 MG PO TABS: 1.0000 | ORAL_TABLET | Freq: Every day | ORAL | 13 refills | Status: DC

## 2017-10-27 MED ORDER — PREPLUS 27-1 MG PO TABS
1.0000 | ORAL_TABLET | Freq: Every day | ORAL | 13 refills | Status: DC
Start: 2017-10-27 — End: 2017-10-27

## 2017-10-27 NOTE — MAU Note (Signed)
Patient presents to MAU with lower abdominal pain and nausea.  She recently moved from [place] and does not have pregnancy medicaid yet.  She has had an ultrasound to verify an IUP and states "I'm at least 3 months pregnant." States EDD is 03/25/18.  Reports that she tested positive for BV and was not treated.  Having some brown discharge.  Denies taking meds for nausea, but states "it's not an unbearable nausea."  Rates abdominal pain 6/10.

## 2017-10-27 NOTE — Progress Notes (Signed)
Patient in hurry to get down to clinic.  Signed printed copy of AVS.  Verbalized understanding of dc instructions.  NO c/o pain at this time.

## 2017-10-27 NOTE — Progress Notes (Signed)
Patient states she is not feeling nausea or abdominal pain today.  Now rates pain 0/10 and denies nausea.  States she was concerned about limited prenatal care since she moved and wanted to "make sure the baby was alright."

## 2017-10-27 NOTE — MAU Provider Note (Signed)
History     CSN: [id number]  Arrival date and time: 10/27/17 4098   First Provider Initiated Contact with Patient 10/27/17 [phone]      Chief Complaint  Patient presents with  . Vaginal Discharge   Kathleen Sparks is a 27 y.o. G2P1 at [redacted]w[redacted]d by LMP who presents to MAU with complaints of vaginal discharge. She reports she recently moved here from [place] and has not started [facility] anywhere as her pregnancy medicaid is not activated. She reports vaginal discharge that has been occurring since early pregnancy was diagnosed with BV but not treated when she was in [place]. She describes vaginal discharge as white thin discharge with odor. She denies abdominal pain, vaginal bleeding, urinary symptoms, HA. She reports occasional nausea but denies vomiting- request medication for nausea. Reports +FM.   OB History    Gravida  2   Para  1   Term  1   Preterm  0   AB  0   Living  1     SAB  0   TAB  0   Ectopic  0   Multiple  0   Live Births  1           Past Medical History:  Diagnosis Date  . Ankle fracture 2001  . Anxiety 2008   SHORT TERM MED  . Infection 2009   UTI  . Infection 2011   CHLAMYDIA    History reviewed. No pertinent surgical history.  Family History  Problem Relation Age of Onset  . Depression Mother   . Hypertension Mother   . Anemia Mother   . Early death Father 18       ALLERGIC REACTION TO BEE STING  . Arthritis Paternal Grandmother   . Asthma Other   . Cancer Maternal Uncle        LIVER;COLON  . Learning disabilities Maternal Uncle   . Alcohol abuse Maternal Uncle     Social History   Tobacco Use  . Smoking status: Never Smoker  . Smokeless tobacco: Never Used  Substance Use Topics  . Alcohol use: Not Currently    Comment: not in pregnancy  . Drug use: Yes    Types: Marijuana    Comment: last used approx  10/14/17    Allergies: No Known Allergies  Medications Prior to Admission  Medication Sig Dispense Refill Last Dose  .  ibuprofen (ADVIL,MOTRIN) 600 MG tablet Take 1 tablet (600 mg total) by mouth every 6 (six) hours. (Patient not taking: Reported on 10/27/2017) 30 tablet 0 Not Taking at Unknown time  . oxyCODONE-acetaminophen (PERCOCET/ROXICET) 5-325 MG per tablet Take 1-2 tablets by mouth every 4 (four) hours as needed. (Patient not taking: Reported on 10/27/2017) 12 tablet 0 Not Taking at Unknown time    Review of Systems  Constitutional: Negative.   Respiratory: Negative.   Cardiovascular: Negative.   Gastrointestinal: Positive for nausea. Negative for abdominal pain, constipation, diarrhea and vomiting.  Genitourinary: Positive for vaginal discharge. Negative for difficulty urinating, dysuria, frequency, pelvic pain, urgency and vaginal bleeding.  Musculoskeletal: Negative.   Neurological: Negative.     Physical Exam   Blood pressure 118/64, pulse 77, temperature 97.7 F (36.5 C), temperature source Oral, resp. rate 16, weight 68.3 kg, last menstrual period 06/18/2017, SpO2 100 %, unknown if currently breastfeeding.  Physical Exam  Nursing note and vitals reviewed. Constitutional: She is oriented to person, place, and time. She appears well-developed and well-nourished. No distress.  Cardiovascular: Normal rate, regular rhythm  and normal heart sounds.  Respiratory: Effort normal and breath sounds normal. No respiratory distress. She has no wheezes.  GI: Soft. Bowel sounds are normal. There is no tenderness. There is no rebound.  Fundus at umbilicus   Genitourinary: Vaginal discharge found.  Genitourinary Comments: Vaginal swabs collected  Neurological: She is alert and oriented to person, place, and time.  Psychiatric: She has a normal mood and affect. Her behavior is normal. Thought content normal.   FHR: 145 by Doppler  Dilation: Closed/Thick/Posterior   MAU Course  Procedures  MDM Wet prep  GC/C  UA   Results for orders placed or performed during the hospital encounter of 10/27/17  (from the past 24 hour(s))  Urinalysis, Routine w reflex microscopic     Status: None   Collection Time: 10/27/17  9:30 AM  Result Value Ref Range   Color, Urine YELLOW YELLOW   APPearance CLEAR CLEAR   Specific Gravity, Urine 1.015 1.005 - 1.030   pH 7.0 5.0 - 8.0   Glucose, UA NEGATIVE NEGATIVE mg/dL   Hgb urine dipstick NEGATIVE NEGATIVE   Bilirubin Urine NEGATIVE NEGATIVE   Ketones, ur NEGATIVE NEGATIVE mg/dL   Protein, ur NEGATIVE NEGATIVE mg/dL   Nitrite NEGATIVE NEGATIVE   Leukocytes, UA NEGATIVE NEGATIVE  Wet prep, genital     Status: Abnormal   Collection Time: 10/27/17 10:10 AM  Result Value Ref Range   Yeast Wet Prep HPF POC NONE SEEN NONE SEEN   Trich, Wet Prep NONE SEEN NONE SEEN   Clue Cells Wet Prep HPF POC PRESENT (A) NONE SEEN   WBC, Wet Prep HPF POC MODERATE (A) NONE SEEN   Sperm NONE SEEN    Wet prep- positive for clue cells, will treat for BV GC/C pending  UA negative   Discussed reasons to return to MAU. Patient directed after discharge to clinic to scheduled NOB appointment. Rx for Prenatal vitamins, zofran and Flagyl sent to pharmacy on file. Pt stable at time of discharge.   Assessment and Plan   1. BV (bacterial vaginosis)   2. Nausea and vomiting during pregnancy   3. Vaginal discharge during pregnancy in second trimester   4. [redacted] weeks gestation of pregnancy    Discharge home Follow up as scheduled for NOB appointment  Return to MAU as needed  Rx for Zofran, PNV and Flagyl sent to pharmacy of choice   Follow-up Information    [facility] Follow up.   Specialty:  Obstetrics and Gynecology Why:  make appointment to be seen to initiate prenatal care Contact information: [address] [place] [address] [phone]         Allergies as of 10/27/2017   No Known Allergies     Medication List    STOP taking these medications   ibuprofen 600 MG tablet Commonly known as:  ADVIL,MOTRIN    oxyCODONE-acetaminophen 5-325 MG tablet Commonly known as:  PERCOCET/ROXICET     TAKE these medications   metroNIDAZOLE 500 MG tablet Commonly known as:  FLAGYL Take 1 tablet (500 mg total) by mouth 2 (two) times daily.   ondansetron 4 MG disintegrating tablet Commonly known as:  ZOFRAN-ODT Take 1 tablet (4 mg total) by mouth every 8 (eight) hours as needed for nausea or vomiting.   PREPLUS 27-1 MG Tabs Take 1 tablet by mouth daily.      Sharyon Cable 10/27/2017, 11:22 AM

## 2017-10-27 NOTE — Telephone Encounter (Signed)
Pt left message stating that she was prescribed several medications earlier today. Her Medicaid is still pending and the pharmacy has told her the out of pocket price for her meds is over $200. She wants to know what she should do because she cannot pay that amount. Please call back.

## 2017-10-28 LAB — GC/CHLAMYDIA PROBE AMP (~~LOC~~) NOT AT ARMC
Chlamydia: NEGATIVE
Neisseria Gonorrhea: NEGATIVE

## 2017-10-28 NOTE — Telephone Encounter (Signed)
LM for pt to returncall

## 2017-10-29 MED ORDER — PREPLUS 27-1 MG PO TABS: 1.0000 | ORAL_TABLET | Freq: Every day | ORAL | 13 refills | Status: DC

## 2017-10-29 NOTE — Telephone Encounter (Signed)
Contacted pt and pt informed me that she could not afford her medications.  I advised the pt to contact her nearest [facility] and [facility] pharmacy to see how much it would cost for the medication for a self pay patient.  I also advised her activate her MyChart so that she can send [place] a message in regards to which pharmacy is cheapest.  Pt stated that she will activate her MyChart and get back with [place].  I informed pt that I would go ahead and send the Rx for her PNV to the [facility] on [address] due to that medication being on there $4 list.  Pt stated thank you and that she will get back with.

## 2017-10-30 ENCOUNTER — Encounter: Payer: Self-pay | Admitting: General Practice

## 2017-11-04 ENCOUNTER — Ambulatory Visit (INDEPENDENT_AMBULATORY_CARE_PROVIDER_SITE_OTHER): Payer: Self-pay | Admitting: Student

## 2017-11-04 ENCOUNTER — Ambulatory Visit: Payer: Self-pay | Admitting: Clinical

## 2017-11-04 ENCOUNTER — Encounter: Payer: Self-pay | Admitting: *Deleted

## 2017-11-04 VITALS — BP 117/65 | HR 89 | Wt 152.2 lb

## 2017-11-04 DIAGNOSIS — Z3482 Encounter for supervision of other normal pregnancy, second trimester: Secondary | ICD-10-CM

## 2017-11-04 DIAGNOSIS — Z3480 Encounter for supervision of other normal pregnancy, unspecified trimester: Secondary | ICD-10-CM

## 2017-11-04 DIAGNOSIS — B9689 Other specified bacterial agents as the cause of diseases classified elsewhere: Secondary | ICD-10-CM

## 2017-11-04 DIAGNOSIS — N760 Acute vaginitis: Secondary | ICD-10-CM

## 2017-11-04 MED ORDER — METRONIDAZOLE 500 MG PO TABS: 500.0000 mg | ORAL_TABLET | Freq: Two times a day (BID) | ORAL | 0 refills | Status: DC

## 2017-11-04 NOTE — BH Specialist Note (Signed)
Integrated Behavioral Health Initial Visit  MRN: [id number] Name: Kathleen Sparks  Number of Integrated Behavioral Health Clinician visits:: 1/6 Session Start time: 11:14  Session End time: 11:23 Total time: 15 minutes  Type of Service: Integrated Behavioral Health- Individual/Family Interpretor:No. Interpretor Name and Language: n/a   Warm Hand Off Completed.       SUBJECTIVE: Kathleen Sparks is a 27 y.o. female accompanied by n/a Patient was referred by Luna Kitchens, CNM for Initial OB introduction to integrated behavioral health services . Patient reports the following symptoms/concerns: Pt states no particular concern today. Duration of problem: n/a; Severity of problem: n/a  OBJECTIVE: Mood: Normal and Affect: Appropriate Risk of harm to self or others: No plan to harm self or others  LIFE CONTEXT: Family and Social: Pt lives with her 5yo son School/Work: Works full-time at [employer]: - Life Changes: Current pregnancy  INTERVENTIONS: Standardized Assessments completed: GAD-7 and PHQ 9  ASSESSMENT: Patient currently experiencing Supervision of other normal pregnancy, antepartum.   Patient may benefit from Initial OB introduction to integrated behavioral health services .  PLAN: 1. Follow up with behavioral health clinician on : As needed 2. Behavioral recommendations:  -Continue taking prenatal vitamin, as recommended by medical provider 3. Referral(s): Integrated [employer] (In Clinic) 4. "From scale of 1-10, how likely are you to follow plan?": 10  Rae Lips, LCSW  Depression screen [facility] 2/9 11/04/2017  Decreased Interest 0  Down, Depressed, Hopeless 0  PHQ - 2 Score 0  Altered sleeping 1  Tired, decreased energy 1  Change in appetite 0  Feeling bad or failure about yourself  0  Trouble concentrating 0  Moving slowly or fidgety/restless 0  Suicidal thoughts 0  PHQ-9 Score 2   GAD 7 : Generalized Anxiety Score  11/04/2017  Nervous, Anxious, on Edge 0  Control/stop worrying 0  Worry too much - different things 0  Trouble relaxing 0  Restless 0  Easily annoyed or irritable 0  Afraid - awful might happen 0  Total GAD 7 Score 0

## 2017-11-04 NOTE — Patient Instructions (Signed)
Second Trimester of Pregnancy The second trimester is from week 13 through week 28, month 4 through 6. This is often the time in pregnancy that you feel your best. Often times, morning sickness has lessened or quit. You may have more energy, and you may get hungry more often. Your unborn baby (fetus) is growing rapidly. At the end of the sixth month, he or she is about 9 inches long and weighs about 1 pounds. You will likely feel the baby move (quickening) between 18 and 20 weeks of pregnancy. Follow these instructions at home:  Avoid all smoking, herbs, and alcohol. Avoid drugs not approved by your doctor.  Do not use any tobacco products, including cigarettes, chewing tobacco, and electronic cigarettes. If you need help quitting, ask your doctor. You may get counseling or other support to help you quit.  Only take medicine as told by your doctor. Some medicines are safe and some are not during pregnancy.  Exercise only as told by your doctor. Stop exercising if you start having cramps.  Eat regular, healthy meals.  Wear a good support bra if your breasts are tender.  Do not use hot tubs, steam rooms, or saunas.  Wear your seat belt when driving.  Avoid raw meat, uncooked cheese, and liter boxes and soil used by cats.  Take your prenatal vitamins.  Take 1500-2000 milligrams of calcium daily starting at the 20th week of pregnancy until you deliver your baby.  Try taking medicine that helps you poop (stool softener) as needed, and if your doctor approves. Eat more fiber by eating fresh fruit, vegetables, and whole grains. Drink enough fluids to keep your pee (urine) clear or pale yellow.  Take warm water baths (sitz baths) to soothe pain or discomfort caused by hemorrhoids. Use hemorrhoid cream if your doctor approves.  If you have puffy, bulging veins (varicose veins), wear support hose. Raise (elevate) your feet for 15 minutes, 3-4 times a day. Limit salt in your diet.  Avoid heavy  lifting, wear low heals, and sit up straight.  Rest with your legs raised if you have leg cramps or low back pain.  Visit your dentist if you have not gone during your pregnancy. Use a soft toothbrush to brush your teeth. Be gentle when you floss.  You can have sex (intercourse) unless your doctor tells you not to.  Go to your doctor visits. Get help if:  You feel dizzy.  You have mild cramps or pressure in your lower belly (abdomen).  You have a nagging pain in your belly area.  You continue to feel sick to your stomach (nauseous), throw up (vomit), or have watery poop (diarrhea).  You have bad smelling fluid coming from your vagina.  You have pain with peeing (urination). Get help right away if:  You have a fever.  You are leaking fluid from your vagina.  You have spotting or bleeding from your vagina.  You have severe belly cramping or pain.  You lose or gain weight rapidly.  You have trouble catching your breath and have chest pain.  You notice sudden or extreme puffiness (swelling) of your face, hands, ankles, feet, or legs.  You have not felt the baby move in over an hour.  You have severe headaches that do not go away with medicine.  You have vision changes. This information is not intended to replace advice given to you by your health care provider. Make sure you discuss any questions you have with your health care   provider. Document Released: 03/27/2009 Document Revised: 06/08/2015 Document Reviewed: 03/03/2012 Elsevier Interactive Patient Education  2017 Elsevier Inc.  

## 2017-11-04 NOTE — Progress Notes (Signed)
  Subjective:    Kathleen Sparks is being seen today for her first obstetrical visit.  This is not a planned pregnancy. She is at [redacted]w[redacted]d gestation. Her obstetrical history is significant for nothing. . Relationship with FOB: not with FOB; he is in [place] Marland Kitchen Patient does intend to breast feed. Pregnancy history fully reviewed.  Patient reports no complaints.  Review of Systems:   Review of Systems  Constitutional: Negative.   HENT: Negative.   Respiratory: Negative.   Cardiovascular: Negative.   Gastrointestinal: Negative.   Genitourinary: Negative.   Neurological: Negative.     Objective:     BP 117/65   Pulse 89   Wt 152 lb 3.2 oz (69 kg)   LMP 06/18/2017 (Exact Date)   BMI 23.84 kg/m  Physical Exam  Constitutional: She is oriented to person, place, and time. She appears well-developed and well-nourished.  HENT:  Head: Normocephalic.  Neck: Normal range of motion.  GI: Soft.  Musculoskeletal: Normal range of motion.  Neurological: She is alert and oriented to person, place, and time.  Skin: Skin is warm and dry.  Psychiatric: She has a normal mood and affect.    Exam    Assessment:    Pregnancy: G2P1001 Patient Active Problem List   Diagnosis Date Noted  . Supervision of other normal pregnancy, antepartum 11/04/2017  . Chlamydia infection 09/16/2012  . Candidal vaginitis 09/16/2012  . UTI (urinary tract infection) 09/16/2012  . Uterine size date discrepancy 09/16/2012  . PANIC DISORDER 10/20/2009  . TREMOR 10/20/2009       Plan:     Initial labs drawn. Prenatal vitamins. Problem list reviewed and updated. AFP3 discussed: will do NIPS. Role of ultrasound in pregnancy discussed; fetal survey: ordered. Amniocentesis discussed: not indicated. Follow up in 3 weeks (around 22 weeks, and then come back at 28 weeks; will send message to [facility] to schedule next ob appt.  50% of 30 min visit spent on counseling and coordination of care.  -Welcomed patient to  practice; reviewed nature of practice , including APPs, students, residents.   -Enrolled in [employer]; she knows she will get BP cuff in the mail and will check her BP weekly.  -genetic testing today   Charlesetta Garibaldi [facility] 11/04/2017

## 2017-11-05 ENCOUNTER — Ambulatory Visit (HOSPITAL_COMMUNITY)
Admission: RE | Admit: 2017-11-05 | Discharge: 2017-11-05 | Disposition: A | Discharge status: Home / Self Care | Payer: Medicaid Other | Source: Ambulatory Visit | Attending: Student | Admitting: Student

## 2017-11-05 ENCOUNTER — Other Ambulatory Visit: Payer: Self-pay | Admitting: Student

## 2017-11-05 DIAGNOSIS — O358XX0 Maternal care for other (suspected) fetal abnormality and damage, not applicable or unspecified: Secondary | ICD-10-CM | POA: Diagnosis not present

## 2017-11-05 DIAGNOSIS — Z3480 Encounter for supervision of other normal pregnancy, unspecified trimester: Secondary | ICD-10-CM

## 2017-11-05 DIAGNOSIS — Z363 Encounter for antenatal screening for malformations: Secondary | ICD-10-CM

## 2017-11-05 DIAGNOSIS — Z3482 Encounter for supervision of other normal pregnancy, second trimester: Secondary | ICD-10-CM | POA: Insufficient documentation

## 2017-11-05 DIAGNOSIS — O283 Abnormal ultrasonic finding on antenatal screening of mother: Secondary | ICD-10-CM | POA: Diagnosis not present

## 2017-11-05 DIAGNOSIS — Z3A20 20 weeks gestation of pregnancy: Secondary | ICD-10-CM | POA: Insufficient documentation

## 2017-11-06 ENCOUNTER — Other Ambulatory Visit (HOSPITAL_COMMUNITY): Payer: Self-pay | Admitting: *Deleted

## 2017-11-06 DIAGNOSIS — O358XX0 Maternal care for other (suspected) fetal abnormality and damage, not applicable or unspecified: Secondary | ICD-10-CM

## 2017-11-06 DIAGNOSIS — O35EXX0 Maternal care for other (suspected) fetal abnormality and damage, fetal genitourinary anomalies, not applicable or unspecified: Secondary | ICD-10-CM

## 2017-11-06 LAB — AFP, SERUM, OPEN SPINA BIFIDA
AFP MoM: 1.24
AFP Value: 75.6 ng/mL
GEST. AGE ON COLLECTION DATE: 19.9 wk
Maternal Age At EDD: 28.5 yr
OSBR Risk 1 IN: 10000
Test Results:: NEGATIVE
Weight: 152 [lb_av]

## 2017-11-07 LAB — CULTURE, OB URINE

## 2017-11-07 LAB — URINE CULTURE, OB REFLEX

## 2017-11-19 ENCOUNTER — Encounter: Payer: Self-pay | Admitting: *Deleted

## 2017-11-27 ENCOUNTER — Encounter: Payer: Self-pay | Admitting: Obstetrics and Gynecology

## 2017-11-27 ENCOUNTER — Ambulatory Visit: Payer: Medicaid Other | Admitting: Obstetrics and Gynecology

## 2017-11-27 ENCOUNTER — Other Ambulatory Visit: Payer: Self-pay

## 2017-11-27 VITALS — BP 114/73 | HR 82 | Wt 154.0 lb

## 2017-11-27 DIAGNOSIS — Z3480 Encounter for supervision of other normal pregnancy, unspecified trimester: Secondary | ICD-10-CM

## 2017-11-27 NOTE — Progress Notes (Signed)
Ms. Francetta FoundGabrielle Slinker is a G2P1001 at 8460w1d transferring care from [facility] (where she transferred care from  [facility] [place]). Health records reviewed by CNM prior to visit. Patient left exam room before being seen by provider stating that she has to go to work and will need to be rescheduled.  2 hr GTT and OB visit rescheduled for 12/25/2017.  Raelyn MoraRolitta Aravind Chrismer, CNM  11/27/2017 2:14 PM

## 2017-11-28 ENCOUNTER — Inpatient Hospital Stay (HOSPITAL_COMMUNITY)
Admission: AD | Admit: 2017-11-28 | Discharge: 2017-11-29 | Disposition: A | Discharge status: Home / Self Care | Payer: Medicaid Other | Source: Ambulatory Visit | Attending: Obstetrics & Gynecology | Admitting: Obstetrics & Gynecology

## 2017-11-28 ENCOUNTER — Encounter (HOSPITAL_COMMUNITY): Payer: Self-pay | Admitting: *Deleted

## 2017-11-28 DIAGNOSIS — O26892 Other specified pregnancy related conditions, second trimester: Secondary | ICD-10-CM | POA: Insufficient documentation

## 2017-11-28 DIAGNOSIS — O23599 Infection of other part of genital tract in pregnancy, unspecified trimester: Secondary | ICD-10-CM

## 2017-11-28 DIAGNOSIS — Z3A23 23 weeks gestation of pregnancy: Secondary | ICD-10-CM

## 2017-11-28 DIAGNOSIS — O26899 Other specified pregnancy related conditions, unspecified trimester: Secondary | ICD-10-CM

## 2017-11-28 DIAGNOSIS — B9689 Other specified bacterial agents as the cause of diseases classified elsewhere: Secondary | ICD-10-CM | POA: Insufficient documentation

## 2017-11-28 DIAGNOSIS — O23592 Infection of other part of genital tract in pregnancy, second trimester: Secondary | ICD-10-CM | POA: Insufficient documentation

## 2017-11-28 DIAGNOSIS — R1032 Left lower quadrant pain: Secondary | ICD-10-CM | POA: Diagnosis present

## 2017-11-28 DIAGNOSIS — R102 Pelvic and perineal pain: Secondary | ICD-10-CM | POA: Diagnosis not present

## 2017-11-28 DIAGNOSIS — Z3480 Encounter for supervision of other normal pregnancy, unspecified trimester: Secondary | ICD-10-CM

## 2017-11-28 LAB — URINALYSIS, ROUTINE W REFLEX MICROSCOPIC
Bilirubin Urine: NEGATIVE
Glucose, UA: NEGATIVE mg/dL
HGB URINE DIPSTICK: NEGATIVE
KETONES UR: NEGATIVE mg/dL
NITRITE: NEGATIVE
Protein, ur: NEGATIVE mg/dL
SPECIFIC GRAVITY, URINE: 1.033 — AB (ref 1.005–1.030)
pH: 5 (ref 5.0–8.0)

## 2017-11-28 LAB — WET PREP, GENITAL
Sperm: NONE SEEN
TRICH WET PREP: NONE SEEN
Yeast Wet Prep HPF POC: NONE SEEN

## 2017-11-28 NOTE — MAU Provider Note (Signed)
History     CSN: [id number]  Arrival date and time: 11/28/17 2207   First Provider Initiated Contact with Patient 11/28/17 2257      Chief Complaint  Patient presents with  . Pelvic Pain   Kathleen Sparks is a 27 y.o. G2P1001 at 7440w3d who presents today with left lower abdominal pain. She denies any VB or LOF. She reports normal fetal movement. She states that she has to stand for long hours at work, and she also does a lot of lifting at work.   Pelvic Pain  The patient's primary symptoms include pelvic pain. The patient's pertinent negatives include no vaginal discharge. This is a new problem. The current episode started today. The problem has been gradually worsening. Pain severity now: 6/10. The problem affects the left side. She is pregnant. Associated symptoms include frequency. Pertinent negatives include no chills, dysuria, fever, nausea, urgency or vomiting. Constipation: last  BM today 11/28/17  The vaginal discharge was normal. There has been no bleeding. Nothing aggravates the symptoms. She has tried acetaminophen for the symptoms. The treatment provided no relief.    OB History    Gravida  2   Para  1   Term  1   Preterm  0   AB  0   Living  1     SAB  0   TAB  0   Ectopic  0   Multiple  0   Live Births  1           Past Medical History:  Diagnosis Date  . Ankle fracture 2001  . Anxiety 2008   SHORT TERM MED  . Infection 2009   UTI  . Infection 2011   CHLAMYDIA    Past Surgical History:  Procedure Laterality Date  . NO PAST SURGERIES      Family History  Problem Relation Age of Onset  . Depression Mother   . Hypertension Mother   . Anemia Mother   . Early death Father [id number]       ALLERGIC REACTION TO BEE STING  . Arthritis Paternal Grandmother   . Asthma Other   . Cancer Maternal Uncle        LIVER;COLON  . Learning disabilities Maternal Uncle   . Alcohol abuse Maternal Uncle     Social History   Tobacco Use  . Smoking  status: Never Smoker  . Smokeless tobacco: Never Used  Substance Use Topics  . Alcohol use: Not Currently    Comment: not in pregnancy  . Drug use: Yes    Types: Marijuana    Comment: last used approx  10/14/17    Allergies: No Known Allergies  Medications Prior to Admission  Medication Sig Dispense Refill Last Dose  . metroNIDAZOLE (FLAGYL) 500 MG tablet Take 1 tablet (500 mg total) by mouth 2 (two) times daily. 14 tablet 0 Past Month at Unknown time  . Prenatal Vit-Fe Fumarate-FA (PREPLUS) 27-1 MG TABS Take 1 tablet by mouth daily. 30 tablet 13 11/28/2017 at Unknown time  . metroNIDAZOLE (FLAGYL) 500 MG tablet Take 1 tablet (500 mg total) by mouth 2 (two) times daily. (Patient not taking: Reported on 11/04/2017) 14 tablet 0 Not Taking  . ondansetron (ZOFRAN ODT) 4 MG disintegrating tablet Take 1 tablet (4 mg total) by mouth every 8 (eight) hours as needed for nausea or vomiting. (Patient not taking: Reported on 11/04/2017) 30 tablet 1 Not Taking    Review of Systems  Constitutional: Negative for chills and  fever.  Gastrointestinal: Negative for nausea and vomiting. Constipation: last  BM today 11/28/17   Genitourinary: Positive for frequency and pelvic pain. Negative for dysuria, urgency, vaginal bleeding and vaginal discharge.   Physical Exam   Blood pressure 123/72, pulse 73, temperature 97.9 F (36.6 C), resp. rate 18, height 5\' 7"  (1.702 m), weight 69.9 kg, last menstrual period 06/18/2017, unknown if currently breastfeeding.  Physical Exam  Nursing note and vitals reviewed. Constitutional: She is oriented to person, place, and time. She appears well-developed and well-nourished. No distress.  HENT:  Head: Normocephalic.  Cardiovascular: Normal rate.  Respiratory: Effort normal.  GI: Soft. There is no tenderness. There is no rebound.  Genitourinary:  Genitourinary Comments:  External: no lesion Vagina: large amount of tan discharge  Cervix: pink, smooth, some contact  bleeding during exam. Closed/thick  Uterus: AGA    Neurological: She is alert and oriented to person, place, and time.  Skin: Skin is warm and dry.  Psychiatric: She has a normal mood and affect.   NST:  Baseline: 145 Variability: moderate Accels: 10x10 Decels: none Toco: none   Results for orders placed or performed during the hospital encounter of 11/28/17 (from the past 24 hour(s))  Urinalysis, Routine w reflex microscopic     Status: Abnormal   Collection Time: 11/28/17 10:20 PM  Result Value Ref Range   Color, Urine YELLOW YELLOW   APPearance CLOUDY (A) CLEAR   Specific Gravity, Urine 1.033 (H) 1.005 - 1.030   pH 5.0 5.0 - 8.0   Glucose, UA NEGATIVE NEGATIVE mg/dL   Hgb urine dipstick NEGATIVE NEGATIVE   Bilirubin Urine NEGATIVE NEGATIVE   Ketones, ur NEGATIVE NEGATIVE mg/dL   Protein, ur NEGATIVE NEGATIVE mg/dL   Nitrite NEGATIVE NEGATIVE   Leukocytes, UA MODERATE (A) NEGATIVE   RBC / HPF 11-20 0 - 5 RBC/hpf   WBC, UA 11-20 0 - 5 WBC/hpf   Bacteria, UA RARE (A) NONE SEEN   Squamous Epithelial / LPF 6-10 0 - 5   Mucus PRESENT   Wet prep, genital     Status: Abnormal   Collection Time: 11/28/17 11:12 PM  Result Value Ref Range   Yeast Wet Prep HPF POC NONE SEEN NONE SEEN   Trich, Wet Prep NONE SEEN NONE SEEN   Clue Cells Wet Prep HPF POC PRESENT (A) NONE SEEN   WBC, Wet Prep HPF POC MODERATE (A) NONE SEEN   Sperm NONE SEEN       MAU Course  Procedures  MDM   Assessment and Plan   1. Pain of round ligament affecting pregnancy, antepartum   2. Supervision of other normal pregnancy, antepartum   3. Bacterial vaginosis in pregnancy   4. [redacted] weeks gestation of pregnancy    DC home Comfort measures reviewed  2nd/3rd Trimester precautions  PTL precautions  Fetal kick counts RX: flagyl BID x 7 #14, maternity support belt rx given with instructions on where to pick up.  Return to MAU as needed FU with OB as planned  Follow-up Information    Center for  [facility] Follow up.   Specialty:  Obstetrics and Gynecology Contact information: [address] [place] [address] [phone]           Thressa Sheller 11/28/2017, 11:03 PM

## 2017-11-28 NOTE — MAU Note (Signed)
Have had cramp LLQ all day. Awoke in pain. Pt is worse with movement. Told 2 extra strength Tylenol but did not help. Tried heating pad without relief. No vag bleeding or d/c

## 2017-11-29 MED ORDER — COMFORT FIT MATERNITY SUPP SM MISC: 1.0000 | Freq: Every day | 0 refills | Status: DC | PRN

## 2017-11-29 MED ORDER — METRONIDAZOLE 500 MG PO TABS: 500.0000 mg | ORAL_TABLET | Freq: Two times a day (BID) | ORAL | 0 refills | Status: DC

## 2017-11-29 NOTE — Discharge Instructions (Signed)

## 2017-11-29 NOTE — MAU Note (Signed)
Pt signed paper avs; signature pad not working

## 2017-12-01 LAB — GC/CHLAMYDIA PROBE AMP (~~LOC~~) NOT AT ARMC
CHLAMYDIA, DNA PROBE: NEGATIVE
Neisseria Gonorrhea: NEGATIVE

## 2017-12-22 ENCOUNTER — Telehealth: Payer: Self-pay | Admitting: *Deleted

## 2017-12-22 DIAGNOSIS — B9689 Other specified bacterial agents as the cause of diseases classified elsewhere: Secondary | ICD-10-CM

## 2017-12-22 DIAGNOSIS — O23599 Infection of other part of genital tract in pregnancy, unspecified trimester: Principal | ICD-10-CM

## 2017-12-22 NOTE — Telephone Encounter (Signed)
Received a phone message requesting missing information for [employer]. I called and informed I did not have an insurance card on file- only have Insurance listed as generic workers comp

## 2017-12-22 NOTE — Telephone Encounter (Signed)
Yes send Rx

## 2017-12-22 NOTE — Telephone Encounter (Signed)
Received a voice message today stating she went to get her flagy but her pharmacy said they did not receive the order. States it was ordered 11/29/17 , was just now going to get it. States she needs it sent to a different location and request a phone call to discuss. Per chart is now [facility] patient - will route to that office.

## 2017-12-23 MED ORDER — METRONIDAZOLE 500 MG PO TABS: 500.0000 mg | ORAL_TABLET | Freq: Two times a day (BID) | ORAL | 0 refills | Status: DC

## 2017-12-23 NOTE — Addendum Note (Signed)
Addended by: Clovis PuMARTIN, Keiji Melland L on: 12/23/2017 10:53 AM   Modules accepted: Orders

## 2017-12-25 ENCOUNTER — Ambulatory Visit (INDEPENDENT_AMBULATORY_CARE_PROVIDER_SITE_OTHER): Payer: Medicaid Other | Admitting: Obstetrics and Gynecology

## 2017-12-25 ENCOUNTER — Other Ambulatory Visit: Payer: Medicaid Other

## 2017-12-25 ENCOUNTER — Encounter: Payer: Self-pay | Admitting: Obstetrics and Gynecology

## 2017-12-25 VITALS — BP 118/84 | HR 84 | Wt 158.4 lb

## 2017-12-25 DIAGNOSIS — Z3480 Encounter for supervision of other normal pregnancy, unspecified trimester: Secondary | ICD-10-CM

## 2017-12-25 DIAGNOSIS — Z23 Encounter for immunization: Secondary | ICD-10-CM | POA: Diagnosis not present

## 2017-12-25 DIAGNOSIS — N949 Unspecified condition associated with female genital organs and menstrual cycle: Secondary | ICD-10-CM

## 2017-12-25 NOTE — Patient Instructions (Addendum)
Tdap Vaccine (Tetanus, Diphtheria and Pertussis): What You Need to Know 1. Why get vaccinated? Tetanus, diphtheria and pertussis are very serious diseases. Tdap vaccine can protect us from these diseases. And, Tdap vaccine given to pregnant women can protect newborn babies against pertussis. TETANUS (Lockjaw) is rare in the [place] today. It causes painful muscle tightening and stiffness, usually all over the body.  It can lead to tightening of muscles in the head and neck so you can't open your mouth, swallow, or sometimes even breathe. Tetanus kills about 1 out of 10 people who are infected even after receiving the best medical care.  DIPHTHERIA is also rare in the [place] today. It can cause a thick coating to form in the back of the throat.  It can lead to breathing problems, heart failure, paralysis, and death.  PERTUSSIS (Whooping Cough) causes severe coughing spells, which can cause difficulty breathing, vomiting and disturbed sleep.  It can also lead to weight loss, incontinence, and rib fractures. Up to 2 in 100 adolescents and 5 in 100 adults with pertussis are hospitalized or have complications, which could include pneumonia or death.  These diseases are caused by bacteria. Diphtheria and pertussis are spread from person to person through secretions from coughing or sneezing. Tetanus enters the body through cuts, scratches, or wounds. Before vaccines, as many as 200,000 cases of diphtheria, 200,000 cases of pertussis, and hundreds of cases of tetanus, were reported in the [place] each year. Since vaccination began, reports of cases for tetanus and diphtheria have dropped by about 99% and for pertussis by about 80%. 2. Tdap vaccine Tdap vaccine can protect adolescents and adults from tetanus, diphtheria, and pertussis. One dose of Tdap is routinely given at age 11 or 12. People who did not get Tdap at that age should get it as soon as possible. Tdap is especially  important for healthcare professionals and anyone having close contact with a baby younger than 12 months. Pregnant women should get a dose of Tdap during every pregnancy, to protect the newborn from pertussis. Infants are most at risk for severe, life-threatening complications from pertussis. Another vaccine, called Td, protects against tetanus and diphtheria, but not pertussis. A Td booster should be given every 10 years. Tdap may be given as one of these boosters if you have never gotten Tdap before. Tdap may also be given after a severe cut or burn to prevent tetanus infection. Your doctor or the person giving you the vaccine can give you more information. Tdap may safely be given at the same time as other vaccines. 3. Some people should not get this vaccine  A person who has ever had a life-threatening allergic reaction after a previous dose of any diphtheria, tetanus or pertussis containing vaccine, OR has a severe allergy to any part of this vaccine, should not get Tdap vaccine. Tell the person giving the vaccine about any severe allergies.  Anyone who had coma or long repeated seizures within 7 days after a childhood dose of DTP or DTaP, or a previous dose of Tdap, should not get Tdap, unless a cause other than the vaccine was found. They can still get Td.  Talk to your doctor if you: ? have seizures or another nervous system problem, ? had severe pain or swelling after any vaccine containing diphtheria, tetanus or pertussis, ? ever had a condition called Guillain-Barr Syndrome (GBS), ? aren't feeling well on the day the shot is scheduled. 4. Risks With any medicine, including   vaccines, there is a chance of side effects. These are usually mild and go away on their own. Serious reactions are also possible but are rare. Most people who get Tdap vaccine do not have any problems with it. Mild problems following Tdap: (Did not interfere with activities)  Pain where the shot was given (about  3 in 4 adolescents or 2 in 3 adults)  Redness or swelling where the shot was given (about 1 person in 5)  Mild fever of at least 100.4F (up to about 1 in 25 adolescents or 1 in 100 adults)  Headache (about 3 or 4 people in 10)  Tiredness (about 1 person in 3 or 4)  Nausea, vomiting, diarrhea, stomach ache (up to 1 in 4 adolescents or 1 in 10 adults)  Chills, sore joints (about 1 person in 10)  Body aches (about 1 person in 3 or 4)  Rash, swollen glands (uncommon)  Moderate problems following Tdap: (Interfered with activities, but did not require medical attention)  Pain where the shot was given (up to 1 in 5 or 6)  Redness or swelling where the shot was given (up to about 1 in 16 adolescents or 1 in 12 adults)  Fever over 102F (about 1 in 100 adolescents or 1 in 250 adults)  Headache (about 1 in 7 adolescents or 1 in 10 adults)  Nausea, vomiting, diarrhea, stomach ache (up to 1 or 3 people in 100)  Swelling of the entire arm where the shot was given (up to about 1 in 500).  Severe problems following Tdap: (Unable to perform usual activities; required medical attention)  Swelling, severe pain, bleeding and redness in the arm where the shot was given (rare).  Problems that could happen after any vaccine:  People sometimes faint after a medical procedure, including vaccination. Sitting or lying down for about 15 minutes can help prevent fainting, and injuries caused by a fall. Tell your doctor if you feel dizzy, or have vision changes or ringing in the ears.  Some people get severe pain in the shoulder and have difficulty moving the arm where a shot was given. This happens very rarely.  Any medication can cause a severe allergic reaction. Such reactions from a vaccine are very rare, estimated at fewer than 1 in a million doses, and would happen within a few minutes to a few hours after the vaccination. As with any medicine, there is a very remote chance of a vaccine  causing a serious injury or death. The safety of vaccines is always being monitored. For more information, visit: www.cdc.gov/vaccinesafety/ 5. What if there is a serious problem? What should I look for? Look for anything that concerns you, such as signs of a severe allergic reaction, very high fever, or unusual behavior. Signs of a severe allergic reaction can include hives, swelling of the face and throat, difficulty breathing, a fast heartbeat, dizziness, and weakness. These would usually start a few minutes to a few hours after the vaccination. What should I do?  If you think it is a severe allergic reaction or other emergency that can't wait, call 9-1-1 or get the person to the nearest hospital. Otherwise, call your doctor.  Afterward, the reaction should be reported to the Vaccine Adverse Event Reporting System (VAERS). Your doctor might file this report, or you can do it yourself through the VAERS web site at www.vaers.hhs.gov, or by calling 1-800-822-7967. ? VAERS does not give medical advice. 6. The National Vaccine Injury Compensation Program The National   Vaccine Injury Compensation Program (VICP) is a federal program that was created to compensate people who may have been injured by certain vaccines. Persons who believe they may have been injured by a vaccine can learn about the program and about filing a claim by calling 1-813-309-4399 or visiting the VICP website at SpiritualWord.atwww.hrsa.gov/vaccinecompensation. There is a time limit to file a claim for compensation. 7. How can I learn more?  Ask your doctor. He or she can give you the vaccine package insert or suggest other sources of information.  Call your local or state health department.  Contact the Centers for Disease Control and Prevention (CDC): ? Call 951-143-89721-(985)139-6294 (1-800-CDC-INFO) or ? Visit CDC's website at PicCapture.uywww.cdc.gov/vaccines CDC Tdap Vaccine VIS (03/09/13) This information is not intended to replace advice given to you by your  health care provider. Make sure you discuss any questions you have with your health care provider. Document Released: 07/02/2011 Document Revised: 09/21/2015 Document Reviewed: 09/21/2015 Elsevier Interactive Patient Education  2017 Elsevier Inc.    Natural Remedies for Bacterial Vaginosis  Option #1 2 Tbsp Fractitionated Coconut Oil 10 drops of Melaleuca (Tea Tree) Oil  Mix ingredients together well.  Soak 3-4 tampons (in applicators) in that mixture until all or mostly all mixture is soaked up into the tampons.  Insert 1 saturated tampon vaginally and wear overnight for 3-4 nights.    Option #2 (sometimes to be used in conjunction with option #1) Fill tub with enough to cover lap/lower abdomen warm water.  Mix 1/4 to 1/2 cup of baking soda in water.  Soak in water/baking soda mixture for at least 20 minutes.  Be sure to swish water in between legs to get as much in vagina as possible.  This soak should be done after sexual intercourse and menstrual cycles.    GO WHITE: Soap: UNSCENTED Dove (white box light green writing) Laundry detergent (underwear)- Dreft or Arm n[employer] unscented WHITE 100% cotton panties (NOT just cotton crouch) Sanitary napkin/panty liners: UNSCENTED.  If it doesn't SAY unscented it can have a scent/perfume    NO PERFUMES OR LOTIONS OR POTIONS in the vulvar area (may use regular KY) Condoms: hypoallergenic only. Non dyed (no color) Toilet papers: white only Wash clothes: use a separate wash cloth. WHITE.  Wash in [employer].

## 2017-12-25 NOTE — Progress Notes (Signed)
   PRENATAL VISIT NOTE  Subjective:  Kathleen Sparks is a 27 y.o. G2P1001 at 7042w1d being seen today for ongoing prenatal care.  She is currently monitored for the following issues for this low-risk pregnancy and has PANIC DISORDER; TREMOR; Chlamydia infection; Candidal vaginitis; UTI (urinary tract infection); Uterine size date discrepancy; Supervision of other normal pregnancy, antepartum; and Round ligament pain on their problem list.  Patient reports no bleeding, no contractions, no cramping, no leaking and round ligament pains.  Contractions: Not present. Vag. Bleeding: None.  Movement: Present. Denies leaking of fluid.   The following portions of the patient's history were reviewed and updated as appropriate: allergies, current medications, past family history, past medical history, past social history, past surgical history and problem list. Problem list updated.  Objective:   Vitals:   12/25/17 0933  BP: 118/84  Pulse: 84  Weight: 71.8 kg    Fetal Status: Fetal Heart Rate (bpm): 148 Fundal Height: 26 cm Movement: Present     General:  Alert, oriented and cooperative. Patient is in no acute distress.  Skin: Skin is warm and dry. No rash noted.   Cardiovascular: Normal heart rate noted  Respiratory: Normal respiratory effort, no problems with respiration noted  Abdomen: Soft, gravid, appropriate for gestational age.  Pain/Pressure: Present     Pelvic: Cervical exam deferred        Extremities: Normal range of motion.  Edema: None  Mental Status: Normal mood and affect. Normal behavior. Normal judgment and thought content.   Assessment and Plan:  Pregnancy: G2P1001 at 3042w1d  1. Supervision of other normal pregnancy, antepartum - Tdap vaccine greater than or equal to 7yo IM - Inheritest(R) CF/SMA Panel  2. Round ligament pain - stretches and round ligament massage demonstrated   Preterm labor symptoms and general obstetric precautions including but not limited to vaginal  bleeding, contractions, leaking of fluid and fetal movement were reviewed in detail with the patient. Please refer to After Visit Summary for other counseling recommendations.  Return in about 2 weeks (around 01/08/2018) for Return OB visit.  Future Appointments  Date Time Provider Department Center  12/31/2017  9:30 AM [facility] US 1 [facility]  01/08/2018  2:10 PM Raelyn Moraawson, Rolitta, CNM [facility] None    Bernerd LimboJamilla R Melainie Krinsky, Student-MidWife

## 2017-12-25 NOTE — Progress Notes (Signed)
Patient is her for lab work only.

## 2017-12-26 LAB — CBC
Hematocrit: 34.9 % (ref 34.0–46.6)
Hemoglobin: 12.1 g/dL (ref 11.1–15.9)
MCH: 31.3 pg (ref 26.6–33.0)
MCHC: 34.7 g/dL (ref 31.5–35.7)
MCV: 90 fL (ref 79–97)
PLATELETS: 192 10*3/uL (ref 150–450)
RBC: 3.87 x10E6/uL (ref 3.77–5.28)
RDW: 12.9 % (ref 12.3–15.4)
WBC: 13 10*3/uL — AB (ref 3.4–10.8)

## 2017-12-26 LAB — GLUCOSE TOLERANCE, 2 HOURS W/ 1HR
GLUCOSE, 1 HOUR: 95 mg/dL (ref 65–179)
GLUCOSE, 2 HOUR: 76 mg/dL (ref 65–152)
Glucose, Fasting: 71 mg/dL (ref 65–91)

## 2017-12-26 LAB — HIV ANTIBODY (ROUTINE TESTING W REFLEX): HIV SCREEN 4TH GENERATION: NONREACTIVE

## 2017-12-26 LAB — RPR: RPR: NONREACTIVE

## 2017-12-31 ENCOUNTER — Encounter (HOSPITAL_COMMUNITY): Payer: Self-pay

## 2017-12-31 ENCOUNTER — Ambulatory Visit (HOSPITAL_COMMUNITY)
Admission: RE | Admit: 2017-12-31 | Discharge: 2017-12-31 | Disposition: A | Discharge status: Home / Self Care | Payer: Medicaid Other | Source: Ambulatory Visit | Attending: Obstetrics and Gynecology | Admitting: Obstetrics and Gynecology

## 2017-12-31 DIAGNOSIS — O26843 Uterine size-date discrepancy, third trimester: Secondary | ICD-10-CM | POA: Diagnosis not present

## 2017-12-31 DIAGNOSIS — O358XX0 Maternal care for other (suspected) fetal abnormality and damage, not applicable or unspecified: Secondary | ICD-10-CM

## 2017-12-31 DIAGNOSIS — Z363 Encounter for antenatal screening for malformations: Secondary | ICD-10-CM | POA: Diagnosis not present

## 2017-12-31 DIAGNOSIS — O283 Abnormal ultrasonic finding on antenatal screening of mother: Secondary | ICD-10-CM | POA: Diagnosis not present

## 2017-12-31 DIAGNOSIS — Z3A28 28 weeks gestation of pregnancy: Secondary | ICD-10-CM | POA: Insufficient documentation

## 2017-12-31 DIAGNOSIS — O35EXX0 Maternal care for other (suspected) fetal abnormality and damage, fetal genitourinary anomalies, not applicable or unspecified: Secondary | ICD-10-CM

## 2018-01-06 LAB — INHERITEST(R) CF/SMA PANEL

## 2018-01-08 ENCOUNTER — Encounter: Payer: Self-pay | Admitting: Obstetrics and Gynecology

## 2018-01-14 NOTE — L&D Delivery Note (Signed)
OB/GYN Faculty Practice Delivery Note  Kathleen Sparks is a 28 y.o. G2P1001 s/p NSVD at [redacted]w[redacted]d. She was admitted for SOL with SROM in the lobby.   ROM: 03/25/2018 at 0920 with thickly stained meconium fluid GBS Status: Negative Maximum Maternal Temperature: Not taken, baby delivered precipitously  Labor Progress: . Labor began overnight, contractions became regular and uncomfortable at 0700, she began involuntarily pushing at 0915 on the way to the hospital  Delivery Date/Time: 03/25/2018 at 0925 Delivery: Called to room and patient was complete and pushing. Head delivered OA. No nuchal cord present. Shoulder and body delivered in usual fashion. NICU team called but infant cried before complete delivery of the body, and was immediately vigorous.  Infant placed on mother's abdomen, dried and stimulated.  Cord clamped x 2 after 3-minute delay, and cut by FOB. Cord blood drawn. Placenta delivered spontaneously with gentle cord traction. Fundus firm with massage and 10mg  IM Pitocin. Labia, perineum, vagina, and cervix inspected inspected with no lacerations or abrasions.   Placenta: Spontaneous, intact Complications: Meconium staining Lacerations: None EBL: 18ml Analgesia: None  Postpartum Planning [x]  message to sent to schedule follow-up  [x]  vaccines UTD, needs flu  Infant: Female  APGARs 9/9  weight pending  Bernerd Limbo [facility]  03/25/18  10:36 AM

## 2018-01-22 ENCOUNTER — Ambulatory Visit (INDEPENDENT_AMBULATORY_CARE_PROVIDER_SITE_OTHER): Payer: Medicaid Other | Admitting: Obstetrics and Gynecology

## 2018-01-22 VITALS — BP 123/79 | HR 84 | Wt 165.2 lb

## 2018-01-22 DIAGNOSIS — Z3483 Encounter for supervision of other normal pregnancy, third trimester: Secondary | ICD-10-CM

## 2018-01-22 DIAGNOSIS — O9989 Other specified diseases and conditions complicating pregnancy, childbirth and the puerperium: Secondary | ICD-10-CM

## 2018-01-22 DIAGNOSIS — R252 Cramp and spasm: Secondary | ICD-10-CM

## 2018-01-22 DIAGNOSIS — Z3A31 31 weeks gestation of pregnancy: Secondary | ICD-10-CM

## 2018-01-22 DIAGNOSIS — Z3480 Encounter for supervision of other normal pregnancy, unspecified trimester: Secondary | ICD-10-CM

## 2018-01-22 MED ORDER — MAGNESIUM 200 MG PO TABS: 2.0000 | ORAL_TABLET | Freq: Every day | ORAL | 1 refills | Status: DC

## 2018-01-22 NOTE — Progress Notes (Signed)
   PRENATAL VISIT NOTE  Subjective:  Kathleen Sparks is a 28 y.o. G2P1001 at 2341w1d being seen today for ongoing prenatal care.  She is currently monitored for the following issues for this low-risk pregnancy and has PANIC DISORDER; TREMOR; Chlamydia infection; Candidal vaginitis; UTI (urinary tract infection); Uterine size date discrepancy; Supervision of other normal pregnancy, antepartum; and Round ligament pain on their problem list.  Patient reports leg cramps, pelvic pain and pressure related to work demands.  Contractions: Not present. Vag. Bleeding: None.  Movement: Present. Denies leaking of fluid. Requesting note for work so she can work mid-shifts only and avoid heavy lifting, will bring paperwork in.  The following portions of the patient's history were reviewed and updated as appropriate: allergies, current medications, past family history, past medical history, past social history, past surgical history and problem list. Problem list updated.  Objective:   Vitals:   01/22/18 1112  BP: 123/79  Pulse: 84  Weight: 74.9 kg    Fetal Status: Fetal Heart Rate (bpm): 146 Fundal Height: 31 cm Movement: Present     General:  Alert, oriented and cooperative. Patient is in no acute distress.  Skin: Skin is warm and dry. No rash noted.   Cardiovascular: Normal heart rate noted  Respiratory: Normal respiratory effort, no problems with respiration noted  Abdomen: Soft, gravid, appropriate for gestational age.  Pain/Pressure: Present     Pelvic: Cervical exam deferred        Extremities: Normal range of motion.  Edema: None  Mental Status: Normal mood and affect. Normal behavior. Normal judgment and thought content.   Assessment and Plan:  Pregnancy: G2P1001 at 6641w1d  1. Supervision of other normal pregnancy, antepartum - Ambulatory referral to Chiropractic - Advised she can take OTC Tylenol for pain PRN  2. Leg cramps in pregnancy - Magnesium 200 MG TABS; Take 2 tablets (400 mg  total) by mouth daily.  Dispense: 60 each; Refill: 1  Preterm labor symptoms and general obstetric precautions including but not limited to vaginal bleeding, contractions, leaking of fluid and fetal movement were reviewed in detail with the patient. Please refer to After Visit Summary for other counseling recommendations.   Future Appointments  Date Time Provider Department Center  02/04/2018  9:50 AM Raelyn Moraawson, Rolitta, CNM [facility] None    Bernerd LimboJamilla R Boleslaus Holloway, Student-MidWife

## 2018-02-02 ENCOUNTER — Encounter: Payer: Self-pay | Admitting: General Practice

## 2018-02-03 ENCOUNTER — Other Ambulatory Visit: Payer: Self-pay

## 2018-02-03 ENCOUNTER — Inpatient Hospital Stay (HOSPITAL_COMMUNITY)
Admission: AD | Admit: 2018-02-03 | Discharge: 2018-02-03 | Disposition: A | Discharge status: Home / Self Care | Payer: Managed Care, Other (non HMO) | Attending: Obstetrics and Gynecology | Admitting: Obstetrics and Gynecology

## 2018-02-03 ENCOUNTER — Encounter (HOSPITAL_COMMUNITY): Payer: Self-pay | Admitting: Emergency Medicine

## 2018-02-03 DIAGNOSIS — Z3A32 32 weeks gestation of pregnancy: Secondary | ICD-10-CM | POA: Insufficient documentation

## 2018-02-03 DIAGNOSIS — O26893 Other specified pregnancy related conditions, third trimester: Secondary | ICD-10-CM | POA: Diagnosis not present

## 2018-02-03 DIAGNOSIS — O4700 False labor before 37 completed weeks of gestation, unspecified trimester: Secondary | ICD-10-CM | POA: Diagnosis present

## 2018-02-03 DIAGNOSIS — R1032 Left lower quadrant pain: Secondary | ICD-10-CM | POA: Diagnosis not present

## 2018-02-03 DIAGNOSIS — R8789 Other abnormal findings in specimens from female genital organs: Secondary | ICD-10-CM

## 2018-02-03 DIAGNOSIS — O4703 False labor before 37 completed weeks of gestation, third trimester: Secondary | ICD-10-CM

## 2018-02-03 DIAGNOSIS — O479 False labor, unspecified: Secondary | ICD-10-CM | POA: Diagnosis present

## 2018-02-03 DIAGNOSIS — O09893 Supervision of other high risk pregnancies, third trimester: Secondary | ICD-10-CM

## 2018-02-03 DIAGNOSIS — R109 Unspecified abdominal pain: Secondary | ICD-10-CM | POA: Diagnosis present

## 2018-02-03 DIAGNOSIS — Z3480 Encounter for supervision of other normal pregnancy, unspecified trimester: Secondary | ICD-10-CM

## 2018-02-03 DIAGNOSIS — O09899 Supervision of other high risk pregnancies, unspecified trimester: Secondary | ICD-10-CM

## 2018-02-03 DIAGNOSIS — N949 Unspecified condition associated with female genital organs and menstrual cycle: Secondary | ICD-10-CM

## 2018-02-03 LAB — URINALYSIS, ROUTINE W REFLEX MICROSCOPIC
Bilirubin Urine: NEGATIVE
Glucose, UA: NEGATIVE mg/dL
HGB URINE DIPSTICK: NEGATIVE
KETONES UR: NEGATIVE mg/dL
Leukocytes, UA: NEGATIVE
Nitrite: NEGATIVE
PROTEIN: NEGATIVE mg/dL
Specific Gravity, Urine: 1.02 (ref 1.005–1.030)
pH: 6 (ref 5.0–8.0)

## 2018-02-03 LAB — WET PREP, GENITAL
Clue Cells Wet Prep HPF POC: NONE SEEN
Sperm: NONE SEEN
Trich, Wet Prep: NONE SEEN
Yeast Wet Prep HPF POC: NONE SEEN

## 2018-02-03 LAB — FETAL FIBRONECTIN: FETAL FIBRONECTIN: POSITIVE — AB

## 2018-02-03 MED ORDER — NIFEDIPINE ER OSMOTIC RELEASE 30 MG PO TB24: 30.0000 mg | ORAL_TABLET | Freq: Every day | ORAL | 0 refills | Status: DC

## 2018-02-03 MED ORDER — LACTATED RINGERS IV BOLUS
1000.0000 mL | Freq: Once | INTRAVENOUS | Status: AC
  Administered 2018-02-03: 1000 mL via INTRAVENOUS

## 2018-02-03 MED ORDER — BETAMETHASONE SOD PHOS & ACET 6 (3-3) MG/ML IJ SUSP
12.0000 mg | Freq: Once | INTRAMUSCULAR | Status: AC
  Administered 2018-02-03: 12 mg via INTRAMUSCULAR
  Filled 2018-02-03: qty 2

## 2018-02-03 MED ORDER — NIFEDIPINE 10 MG PO CAPS
10.0000 mg | ORAL_CAPSULE | ORAL | Status: DC | PRN
  Administered 2018-02-03 (×3): 10 mg via ORAL
  Filled 2018-02-03 (×3): qty 1

## 2018-02-03 NOTE — MAU Provider Note (Signed)
History     CSN: [id number]  Arrival date and time: 02/03/18 1142   First Provider Initiated Contact with Patient 02/03/18 1221      Chief Complaint  Patient presents with  . Abdominal Pain   Kathleen Sparks is a 28 y.o. G2P1001 at 7777w6d who presents for Abdominal Pain.  Patient states she has been having constant left side abdominal pain for the last month that is sharp and agitated with movements, but relieved with rest.  She states she was diagnosed with a "pulled round ligament," and the pain is constant but has increased in the last 3 days.  She states that she has tried stretches, rest, maternity support belts, and tylenol without relief.  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  She endorses fetal movement and denies perception of contractions, LOF, or VB.  She denies vaginal concerns including discharge, itching, burning, or odor.  Patient also denies issues with urination, constipation, or diarrhea.       OB History    Gravida  2   Para  1   Term  1   Preterm  0   AB  0   Living  1     SAB  0   TAB  0   Ectopic  0   Multiple  0   Live Births  1           Past Medical History:  Diagnosis Date  . Ankle fracture 2001  . Anxiety 2008   SHORT TERM MED  . Infection 2009   UTI  . Infection 2011   CHLAMYDIA    Past Surgical History:  Procedure Laterality Date  . NO PAST SURGERIES      Family History  Problem Relation Age of Onset  . Depression Mother   . Hypertension Mother   . Anemia Mother   . Early death Father [id number]       ALLERGIC REACTION TO BEE STING  . Arthritis Paternal Grandmother   . Asthma Other   . Cancer Maternal Uncle        LIVER;COLON  . Learning disabilities Maternal Uncle   . Alcohol abuse Maternal Uncle     Social History   Tobacco Use  . Smoking status: Never Smoker  . Smokeless tobacco: Never Used  Substance Use Topics  . Alcohol use: Not Currently    Comment: not in  pregnancy  . Drug use: Yes    Types: Marijuana    Comment: last used approx  10/14/17    Allergies: No Known Allergies  Medications Prior to Admission  Medication Sig Dispense Refill Last Dose  . Elastic Bandages & Supports (COMFORT FIT MATERNITY SUPP SM) MISC 1 Device by Does not apply route daily as needed. 1 each 0 Taking  . Magnesium 200 MG TABS Take 2 tablets (400 mg total) by mouth daily. 60 each 1   . metroNIDAZOLE (FLAGYL) 500 MG tablet Take 1 tablet (500 mg total) by mouth 2 (two) times daily. (Patient not taking: Reported on 01/22/2018) 14 tablet 0 Not Taking  . Prenatal Vit-Fe Fumarate-FA (PREPLUS) 27-1 MG TABS Take 1 tablet by mouth daily. 30 tablet 13 Taking    Review of Systems  Constitutional: Negative for chills and fever.  Gastrointestinal: Positive for abdominal pain (LLQ-Sharp, Constant 5-6/10) and vomiting. Negative for constipation, diarrhea and nausea.  Genitourinary: Negative for dysuria, pelvic pain, vaginal bleeding, vaginal discharge and vaginal pain.  Musculoskeletal: Positive for  back pain. Negative for neck pain.  Neurological: Negative for dizziness, light-headedness and headaches.   Physical Exam   Blood pressure 113/72, pulse (!) 103, temperature 98.6 F (37 C), temperature source Oral, resp. rate 18, weight 76.2 kg, last menstrual period 06/18/2017, SpO2 100 %, unknown if currently breastfeeding.  Physical Exam  Constitutional: She is oriented to person, place, and time. She appears well-developed and well-nourished.  HENT:  Head: Normocephalic and atraumatic.  Eyes: Conjunctivae are normal.  Neck: Normal range of motion.  Cardiovascular: Normal rate and regular rhythm.  Respiratory: Effort normal and breath sounds normal.  GI: Soft. Bowel sounds are normal.  Genitourinary: Cervix exhibits friability. Cervix exhibits no motion tenderness and no discharge.    Vaginal discharge present.     No vaginal bleeding.  No bleeding in the vagina.     Genitourinary Comments: Sterile Speculum Exam: -Vaginal Vault: Pink mucosa.  Moderate amt thin white discharge in vault -wet prep and fFN collected -Cervix:Pink, no lesions, cysts, or polyps. Ecotropian and friable.  Appears closed. No active bleeding from os-GC/CT collected -Bimanual Exam: Closed/Long/Thick   Musculoskeletal: Normal range of motion.  Neurological: She is alert and oriented to person, place, and time.  Skin: Skin is warm and dry.  Psychiatric: She has a normal mood and affect. Her behavior is normal.    Fetal Assessment 145 bpm, Mod Var, -Decels, +Accels Toco: Palpates mild to moderate, graphs Q1-45min  MAU Course   Results for orders placed or performed during the hospital encounter of 02/03/18 (from the past 24 hour(s))  Urinalysis, Routine w reflex microscopic     Status: Abnormal   Collection Time: 02/03/18 12:23 PM  Result Value Ref Range   Color, Urine YELLOW YELLOW   APPearance HAZY (A) CLEAR   Specific Gravity, Urine 1.020 1.005 - 1.030   pH 6.0 5.0 - 8.0   Glucose, UA NEGATIVE NEGATIVE mg/dL   Hgb urine dipstick NEGATIVE NEGATIVE   Bilirubin Urine NEGATIVE NEGATIVE   Ketones, ur NEGATIVE NEGATIVE mg/dL   Protein, ur NEGATIVE NEGATIVE mg/dL   Nitrite NEGATIVE NEGATIVE   Leukocytes, UA NEGATIVE NEGATIVE  Wet prep, genital     Status: Abnormal   Collection Time: 02/03/18 12:54 PM  Result Value Ref Range   Yeast Wet Prep HPF POC NONE SEEN NONE SEEN   Trich, Wet Prep NONE SEEN NONE SEEN   Clue Cells Wet Prep HPF POC NONE SEEN NONE SEEN   WBC, Wet Prep HPF POC MANY (A) NONE SEEN   Sperm NONE SEEN   Fetal fibronectin     Status: Abnormal   Collection Time: 02/03/18 12:54 PM  Result Value Ref Range   Fetal Fibronectin POSITIVE (A) NEGATIVE    MDM Pelvic Exam Labs: Wet Prep, GC/CT, UA, fFN Start IV; LR Bolus  BMZ Assessment and Plan  IUP at 32.6wks Cat I FT Contractions LLQ Pain  -Discussed contractions and provider concern for  PTL. -Recommendation for pelvic exam and cultures; patient agreeable.  -Exam findings discussed -Labs Pending -Start IV and Give 1L Bolus over 61 minutes -Give Procardia 10mg  PO Q for maximum of 4 doses -Reactive NST -Will continue to monitor and reassess as appropriate   Follow Up (2:16 PM) -ffN returns positive -Discussed findings and POC with patient including pelvic rest, OOW until at least 37 wks, and steroid injections for fetal lung maturity. -Q/C addressed including those expressed by FOB via phone. -BMZ ordered   Follow Up (3:36 PM) -Consult with Dr. Alysia Penna regarding  patient status and plan of care who advised *Give Procardia XL 30mg  daily instead of 10mg  PRN Q4-6 hrs for contractions *Otherwise agreeable with plan for discharge, repeat bmz tomorrow, pelvic rest, OOW, and follow up in office. -In room to reassess and discuss POC: -Patient reports improvement in pain with procardia dosing. -Agrees with POC and understands need to return to MAU tomorrow for 2nd BMZ dose. -Preterm Labor and Bleeding Precautions Given. -No questions or concerns from patient. -Will send note to office for GBS culture collection at next office. -Note given for OOW until further notice. -Rx for Procardia XL 30mg  sent to pharmacy on file -Encouraged to call or return to MAU if symptoms worsen or with the onset of new symptoms. -Keep appt as scheduled: Feb 05, 2018 -Discharged to home in stable condition  Cherre RobinsJessica L Jaquanna Ballentine MSN, CNM 02/03/2018, 12:50 PM

## 2018-02-03 NOTE — MAU Note (Signed)
Thinks it is round ligament pain.  Strained it a few wks ago.  Started hurting on Sat night, called out from work Sunday, due to pain.  Feels about the same.

## 2018-02-03 NOTE — Discharge Instructions (Signed)

## 2018-02-03 NOTE — MAU Provider Note (Signed)
History  Chief Complaint  Patient presents with  . Abdominal Pain   HPI Kathleen Sparks is a 28yo G2P1001 pregnant at [redacted]w[redacted]d presenting today with complaints of left sided abdominal pain. Patient was diagnosed with round ligament pain 3 weeks ago and says that the pain today feels similar. Patient states that she was treated with tylenol, maternity belt use, stretching, and rest. Patient reports that the pain started 3 days ago and has lasted longer than the previous episode, prompting her visit today. She describes the pain as a "constant squeeze" with any movement that becomes sharp with [place] movements and rates it 5-6/10. She states that laying still in certain positions alleviates the pain. She has not taken any pain relievers d/t them being ineffective last time. Patient reports that she has been sleeping on a air mattress for the past few nights and her job requires a lot of walking, bending, and carrying things, both of which she thinks have aggravated the pain more. She denies vaginal bleeding, vaginal discharge, LOF, difficulty walking, numbness, GI complaints, and urinary complaints. Patient states that she does not believe she is having contractions but EFM shows frequent contractions.    Past Medical History:  Diagnosis Date  . Ankle fracture 2001  . Anxiety 2008   SHORT TERM MED  . Infection 2009   UTI  . Infection 2011   CHLAMYDIA    Past Surgical History:  Procedure Laterality Date  . NO PAST SURGERIES      Family History  Problem Relation Age of Onset  . Depression Mother   . Hypertension Mother   . Anemia Mother   . Early death Father 35       ALLERGIC REACTION TO BEE STING  . Arthritis Paternal Grandmother   . Asthma Other   . Cancer Maternal Uncle        LIVER;COLON  . Learning disabilities Maternal Uncle   . Alcohol abuse Maternal Uncle     Social History   Tobacco Use  . Smoking status: Never Smoker  . Smokeless tobacco: Never Used  Substance Use Topics   . Alcohol use: Not Currently    Comment: not in pregnancy  . Drug use: Yes    Types: Marijuana    Comment: last used approx  10/14/17    Allergies: No Known Allergies  Medications Prior to Admission  Medication Sig Dispense Refill Last Dose  . Elastic Bandages & Supports (COMFORT FIT MATERNITY SUPP SM) MISC 1 Device by Does not apply route daily as needed. 1 each 0 Taking  . Magnesium 200 MG TABS Take 2 tablets (400 mg total) by mouth daily. 60 each 1   . metroNIDAZOLE (FLAGYL) 500 MG tablet Take 1 tablet (500 mg total) by mouth 2 (two) times daily. (Patient not taking: Reported on 01/22/2018) 14 tablet 0 Not Taking  . Prenatal Vit-Fe Fumarate-FA (PREPLUS) 27-1 MG TABS Take 1 tablet by mouth daily. 30 tablet 13 Taking    Review of Systems  Constitutional: Negative for chills and fever.  Gastrointestinal: Positive for abdominal pain (LLQ, "constant" "sharp" 5-6/10). Negative for blood in stool, constipation, diarrhea, nausea and vomiting.  Genitourinary: Negative for dysuria, flank pain, frequency, hematuria and urgency.       Denies vaginal bleeding or discharge and leakage of fluids  Musculoskeletal: Positive for back pain.  Neurological: Negative for sensory change and weakness.     Physical Exam Blood pressure 113/72, pulse (!) 103, temperature 98.6 F (37 C), temperature source Oral, resp.  rate 18, weight 76.2 kg, last menstrual period 06/18/2017, SpO2 100 %, unknown if currently breastfeeding.   Physical Exam  Constitutional: She is oriented to person, place, and time. She appears well-developed and well-nourished. No distress.  HENT:  Head: Normocephalic.  Cardiovascular: Normal rate.  Respiratory: Effort normal. No respiratory distress.  GI: Soft. She exhibits no distension. There is abdominal tenderness (LLQ).  Genitourinary:    No vaginal discharge.     Genitourinary Comments: Minimal cervical discharge noted. There is erythema with mild bleeding of cervix.     Neurological: She is alert and oriented to person, place, and time.    MAU Course Patient was contracting frequently throughout visit with LLQ pain noted to be greatest during contraction. No cervical changes noted on pelvic exam.  Fetal fibronectin to assess for increased risk of preterm birth: positive.  UA and wet prep to identify infection as cause for preterm contractions: negative. GC/CT results pending.   IVF started to treat dehydration as cause of preterm contractions.   Procardia 10mg  given q20 mins until contractions stopped (3 doses given)  MDM Kathleen Sparks is a 28 yo G2P1001 at [redacted]w[redacted]d presenting with LLQ abdominal pain and frequent contractions.  Contractions-   Start LR bolus 1,036ml  Start Procardia 10 mg q20 mins until contractions stop (3 doses administered.  Positive fetal fibronectin-  Administer betamethasone for fetal lung maturation. Patient to return tomorrow to office for second dose.  Pelvic rest  Patient instructed to take time off of work to rest.  Patient conveyed understanding of the meaning of these results and the management plan. She was educated on signs and symptoms of preterm labor and when to seek medical attention.

## 2018-02-04 ENCOUNTER — Ambulatory Visit (INDEPENDENT_AMBULATORY_CARE_PROVIDER_SITE_OTHER): Payer: Managed Care, Other (non HMO) | Admitting: General Practice

## 2018-02-04 ENCOUNTER — Encounter: Payer: Self-pay | Admitting: Obstetrics and Gynecology

## 2018-02-04 MED ORDER — BETAMETHASONE SOD PHOS & ACET 6 (3-3) MG/ML IJ SUSP
12.0000 mg | Freq: Once | INTRAMUSCULAR | Status: AC
  Administered 2018-02-04: 12 mg via INTRAMUSCULAR

## 2018-02-04 NOTE — Progress Notes (Addendum)
Francetta Found here for Betamethasone  Injection.  Injection administered without complication. Patient will follow up at [facility] tomorrow for OB visit.   Marylynn Pearson, RN 02/04/2018  3:53 PM  Reviewed note and agree with the plan of care.  Nolene Bernheim, RN, MSN, NP-BC Nurse Practitioner, [facility] for [employer], [facility] 02/04/2018 9:26 PM

## 2018-02-05 ENCOUNTER — Encounter: Payer: Self-pay | Admitting: Obstetrics and Gynecology

## 2018-02-05 ENCOUNTER — Ambulatory Visit (INDEPENDENT_AMBULATORY_CARE_PROVIDER_SITE_OTHER): Payer: Medicaid Other | Admitting: Obstetrics and Gynecology

## 2018-02-05 VITALS — BP 122/78 | HR 80 | Wt 168.4 lb

## 2018-02-05 DIAGNOSIS — O4703 False labor before 37 completed weeks of gestation, third trimester: Secondary | ICD-10-CM

## 2018-02-05 DIAGNOSIS — Z3480 Encounter for supervision of other normal pregnancy, unspecified trimester: Secondary | ICD-10-CM

## 2018-02-05 DIAGNOSIS — R102 Pelvic and perineal pain: Secondary | ICD-10-CM

## 2018-02-05 DIAGNOSIS — Z3483 Encounter for supervision of other normal pregnancy, third trimester: Secondary | ICD-10-CM

## 2018-02-05 DIAGNOSIS — O26893 Other specified pregnancy related conditions, third trimester: Secondary | ICD-10-CM

## 2018-02-05 LAB — GC/CHLAMYDIA PROBE AMP (~~LOC~~) NOT AT ARMC
Chlamydia: NEGATIVE
Neisseria Gonorrhea: NEGATIVE

## 2018-02-05 MED ORDER — NIFEDIPINE ER OSMOTIC RELEASE 30 MG PO TB24: 30.0000 mg | ORAL_TABLET | Freq: Every day | ORAL | 1 refills | Status: DC

## 2018-02-05 NOTE — Patient Instructions (Signed)
Preventing Preterm Birth Preterm birth is when your baby is delivered between 20 weeks and 37 weeks of pregnancy. A full-term pregnancy lasts for at least 37 weeks. Preterm birth can be dangerous for your baby because the last few weeks of pregnancy are an important time for your baby's brain and lungs to grow. Many things can cause a baby to be born early. Sometimes the cause is not known. There are certain factors that make you more likely to experience preterm birth, such as:  Having a previous baby born preterm.  Being pregnant with twins or other multiples.  Having had fertility treatment.  Being overweight or underweight at the start of your pregnancy.  Having any of the following during pregnancy: ? An infection, including a urinary tract infection (UTI) or an STI (sexually transmitted infection). ? High blood pressure. ? Diabetes. ? Vaginal bleeding.  Being age 35 or older.  Being age 18 or younger.  Getting pregnant within 6 months of a previous pregnancy.  Suffering extreme stress or physical or emotional abuse during pregnancy.  Standing for long periods of time during pregnancy, such as working at a job that requires standing. What are the risks? The most serious risk of preterm birth is that the baby may not survive. This is more likely to happen if a baby is born before 34 weeks. Other risks and complications of preterm birth may include your baby having:  Breathing problems.  Brain damage that affects movement and coordination (cerebral palsy).  Feeding difficulties.  Vision or hearing problems.  Infections or inflammation of the digestive tract (colitis).  Developmental delays.  Learning disabilities.  Higher risk for diabetes, heart disease, and high blood pressure later in life. What can I do to lower my risk?  Medical care The most important thing you can do to lower your risk for preterm birth is to get routine medical care during pregnancy (prenatal  care). If you have a high risk of preterm birth, you may be referred to a health care provider who specializes in managing high-risk pregnancies (perinatologist). You may be given medicine to help prevent preterm birth. Lifestyle changes Certain lifestyle changes can also lower your risk of preterm birth:  Wait at least 6 months after a pregnancy to become pregnant again.  Try to plan pregnancy for when you are between 19 and 35 years old.  Get to a healthy weight before getting pregnant. If you are overweight, work with your health care provider to safely lose weight.  Do not use any products that contain nicotine or tobacco, such as cigarettes and e-cigarettes. If you need help quitting, ask your health care provider.  Do not drink alcohol.  Do not use drugs. Where to find support For more support, consider:  Talking with your health care provider.  Talking with a therapist or substance abuse counselor, if you need help quitting.  Working with a diet and nutrition specialist (dietitian) or a personal trainer to maintain a healthy weight.  Joining a support group. Where to find more information Learn more about preventing preterm birth from:  Centers for Disease Control and Prevention: cdc.gov/reproductivehealth/maternalinfanthealth/pretermbirth.htm  March of Dimes: marchofdimes.org/complications/premature-babies.aspx  American Pregnancy Association: americanpregnancy.org/labor-and-birth/premature-labor Contact a health care provider if:  You have any of the following signs of preterm labor before 37 weeks: ? A change or increase in vaginal discharge. ? Fluid leaking from your vagina. ? Pressure or cramps in your lower abdomen. ? A backache that does not go away or gets worse. ?   Regular tightening (contractions) in your lower abdomen. Summary  Preterm birth means having your baby during weeks 20-37 of pregnancy.  Preterm birth may put your baby at risk for physical and  mental problems.  Getting good prenatal care can help prevent preterm birth.  You can lower your risk of preterm birth by making certain lifestyle changes, such as not smoking and not using alcohol. This information is not intended to replace advice given to you by your health care provider. Make sure you discuss any questions you have with your health care provider. Document Released: 02/14/2015 Document Revised: 09/09/2015 Document Reviewed: 09/09/2015 Elsevier Interactive Patient Education  2019 [employer]. Preterm Labor and Birth Information Pregnancy normally lasts 39-41 weeks. Preterm labor is when labor starts early. It starts before you have been pregnant for 37 whole weeks. What are the risk factors for preterm labor? Preterm labor is more likely to occur in women who:  Have an infection while pregnant.  Have a cervix that is short.  Have gone into preterm labor before.  Have had surgery on their cervix.  Are younger than age 54.  Are older than age 41.  Are African American.  Are pregnant with two or more babies.  Take street drugs while pregnant.  Smoke while pregnant.  Do not gain enough weight while pregnant.  Got pregnant right after another pregnancy. What are the symptoms of preterm labor? Symptoms of preterm labor include:  Cramps. The cramps may feel like the cramps some women get during their period. The cramps may happen with watery poop (diarrhea).  Pain in the belly (abdomen).  Pain in the lower back.  Regular contractions or tightening. It may feel like your belly is getting tighter.  Pressure in the lower belly that seems to get stronger.  More fluid (discharge) leaking from the vagina. The fluid may be watery or bloody.  Water breaking. Why is it important to notice signs of preterm labor? Babies who are born early may not be fully developed. They have a higher chance for:  Long-term heart problems.  Long-term lung  problems.  Trouble controlling body systems, like breathing.  Bleeding in the brain.  A condition called cerebral palsy.  Learning difficulties.  Death. These risks are highest for babies who are born before 34 weeks of pregnancy. How is preterm labor treated? Treatment depends on:  How long you were pregnant.  Your condition.  The health of your baby. Treatment may involve:  Having a stitch (suture) placed in your cervix. When you give birth, your cervix opens so the baby can come out. The stitch keeps the cervix from opening too soon.  Staying at the hospital.  Taking or getting medicines, such as: ? Hormone medicines. ? Medicines to stop contractions. ? Medicines to help the baby's lungs develop. ? Medicines to prevent your baby from having cerebral palsy. What should I do if I am in preterm labor? If you think you are going into labor too soon, call your doctor right away. How can I prevent preterm labor?  Do not use any tobacco products. ? Examples of these are cigarettes, chewing tobacco, and e-cigarettes. ? If you need help quitting, ask your doctor.  Do not use street drugs.  Do not use any medicines unless you ask your doctor if they are safe for you.  Talk with your doctor before taking any herbal supplements.  Make sure you gain enough weight.  Watch for infection. If you think you might have an  infection, get it checked right away. °· If you have gone into preterm labor before, tell your doctor. °This information is not intended to replace advice given to you by your health care provider. Make sure you discuss any questions you have with your health care provider. °Document Released: 03/29/2008 Document Revised: 06/13/2015 Document Reviewed: 05/24/2015 °Elsevier Interactive Patient Education © 2019 Elsevier Inc. ° °

## 2018-02-05 NOTE — Progress Notes (Addendum)
   PRENATAL VISIT NOTE  Subjective:  Kathleen Sparks is a 28 y.o. G2P1001 at [redacted]w[redacted]d being seen today for ongoing prenatal care.  She is currently monitored for the following issues for this low-risk pregnancy and has PANIC DISORDER; TREMOR; Chlamydia infection; Candidal vaginitis; UTI (urinary tract infection); Uterine size date discrepancy; Supervision of other normal pregnancy, antepartum; Round ligament pain; Positive fetal fibronectin at 22 weeks to [redacted] weeks gestation; and Preterm contractions on their problem list.  Patient reports occasional contractions and still experiencing left-sided pelvic pain. She remained OOW since her MAU visit 02/03/2018, but had a return of contractions/pain when she ran errands yesterday. She was not wearing her maternity belt; ctx/pain relieved with rest.  Contractions: Not present. Vag. Bleeding: None.  Movement: Present. Denies leaking of fluid. Had questions about the need to be OOW, since her cervix is not changing.   The following portions of the patient's history were reviewed and updated as appropriate: allergies, current medications, past family history, past medical history, past social history, past surgical history and problem list. Problem list updated.  Objective:   Vitals:   02/05/18 0813  BP: 122/78  Pulse: 80  Weight: 76.4 kg    Fetal Status: Fetal Heart Rate (bpm): 138 Fundal Height: 33 cm Movement: Present     General:  Alert, oriented and cooperative. Patient is in no acute distress.  Skin: Skin is warm and dry. No rash noted.   Cardiovascular: Normal heart rate noted  Respiratory: Normal respiratory effort, no problems with respiration noted  Abdomen: Soft, gravid, appropriate for gestational age.  Pain/Pressure: Present     Pelvic: Cervical exam performed Dilation: Closed Effacement (%): Thick    Extremities: Normal range of motion.  Edema: None  Mental Status: Normal mood and affect. Normal behavior. Normal judgment and thought  content.   Assessment and Plan:  Pregnancy: G2P1001 at [redacted]w[redacted]d  1. Supervision of other normal pregnancy, antepartum - Culture, beta strep (group b only)  2. Pelvic pain affecting pregnancy in third trimester, antepartum  3. Preterm uterine contractions in third trimester, antepartum - Reaffirmed that the pain she feels is contractions, per her last MAU visit, and encouraged her to take the Procardia as prescribed, wear her maternity belt, and rest as much as possible to allow her round ligament to heal - NIFEdipine (PROCARDIA XL) 30 MG 24 hr tablet; Take 1 tablet (30 mg total) by mouth daily.  Dispense: 30 tablet; Refill: 1 -- encouraged to use Good Rx app to find the cheapest place to purchase medication  Preterm labor symptoms and general obstetric precautions including but not limited to vaginal bleeding, contractions, leaking of fluid and fetal movement were reviewed in detail with the patient. Please refer to After Visit Summary for other counseling recommendations.  Return in about 1 week (around 02/12/2018) for Return OB visit.  Future Appointments  Date Time Provider Department Center  02/12/2018  3:30 PM Raelyn Mora, CNM [facility] None  02/19/2018  9:50 AM Raelyn Mora, CNM [facility] None  02/26/2018  1:30 PM Raelyn Mora, CNM [facility] None  03/05/2018  2:50 PM Raelyn Mora, CNM [facility] None  03/12/2018  2:50 PM Raelyn Mora, CNM [facility] None  03/19/2018  2:50 PM Raelyn Mora, CNM [facility] None    Bernerd Limbo, Student-MidWife

## 2018-02-06 ENCOUNTER — Telehealth: Payer: Self-pay | Admitting: *Deleted

## 2018-02-09 LAB — CULTURE, BETA STREP (GROUP B ONLY): Strep Gp B Culture: NEGATIVE

## 2018-02-09 NOTE — Telephone Encounter (Signed)
Late entry: Patient dropped off FMLA forms to be completed. Patient paid $15 for form completion. Forms completed today 02/09/2018. Forms scanned in patient record and faxed per request.  Clovis Pu, RN

## 2018-02-12 ENCOUNTER — Ambulatory Visit (INDEPENDENT_AMBULATORY_CARE_PROVIDER_SITE_OTHER): Payer: Medicaid Other | Admitting: Obstetrics and Gynecology

## 2018-02-12 VITALS — BP 127/76 | HR 92 | Wt 174.2 lb

## 2018-02-12 DIAGNOSIS — Z3480 Encounter for supervision of other normal pregnancy, unspecified trimester: Secondary | ICD-10-CM

## 2018-02-12 DIAGNOSIS — Z3483 Encounter for supervision of other normal pregnancy, third trimester: Secondary | ICD-10-CM

## 2018-02-12 NOTE — Progress Notes (Addendum)
   PRENATAL VISIT NOTE  Subjective:  Kathleen Sparks is a 28 y.o. G2P1001 at [redacted]w[redacted]d being seen today for ongoing prenatal care.  She is currently monitored for the following issues for this low-risk pregnancy and has PANIC DISORDER; TREMOR; Chlamydia infection; Candidal vaginitis; UTI (urinary tract infection); Uterine size date discrepancy; Supervision of other normal pregnancy, antepartum; Round ligament pain; Positive fetal fibronectin at 22 weeks to [redacted] weeks gestation; and Preterm contractions on their problem list.  Patient reports continued pelvic pressure, but not as bad as prior visits.  Contractions: Not present.  .  Movement: Present. Denies leaking of fluid. Patient states she is feeling better than previous visits, left sided pelvic pain has gotten better with rest and consistent use of her maternity belt.  The following portions of the patient's history were reviewed and updated as appropriate: allergies, current medications, past family history, past medical history, past social history, past surgical history and problem list. Problem list updated.  Objective:   Vitals:   02/12/18 1542  BP: 127/76  Pulse: 92  Weight: 79 kg    Fetal Status: Fetal Heart Rate (bpm): 159 Fundal Height: 34 cm Movement: Present     General:  Alert, oriented and cooperative. Patient is in no acute distress.  Skin: Skin is warm and dry. No rash noted.   Cardiovascular: Normal heart rate noted  Respiratory: Normal respiratory effort, no problems with respiration noted  Abdomen: Soft, gravid, appropriate for gestational age.  Pain/Pressure: Present     Pelvic: Cervical exam deferred        Extremities: Normal range of motion.  Edema: None  Mental Status: Normal mood and affect. Normal behavior. Normal judgment and thought content.   Assessment and Plan:  Pregnancy: G2P1001 at [redacted]w[redacted]d  1. Supervision of other normal pregnancy, antepartum  Preterm labor symptoms and general obstetric precautions  including but not limited to vaginal bleeding, contractions, leaking of fluid and fetal movement were reviewed in detail with the patient. Please refer to After Visit Summary for other counseling recommendations.  Return in about 1 week (around 02/19/2018) for ROB.  Future Appointments  Date Time Provider Department Center  02/19/2018  9:50 AM Raelyn Mora, CNM [facility] None  02/27/2018 11:10 AM Raelyn Mora, CNM [facility] None  03/05/2018  9:30 AM Raelyn Mora, CNM [facility] None  03/12/2018  9:10 AM Raelyn Mora, CNM [facility] None  03/19/2018  9:30 AM Raelyn Mora, CNM [facility] None    Bernerd Limbo, Student-MidWife

## 2018-02-19 ENCOUNTER — Ambulatory Visit (INDEPENDENT_AMBULATORY_CARE_PROVIDER_SITE_OTHER): Payer: Managed Care, Other (non HMO) | Admitting: Obstetrics and Gynecology

## 2018-02-19 VITALS — BP 131/78 | HR 93 | Wt 173.6 lb

## 2018-02-19 DIAGNOSIS — Z3480 Encounter for supervision of other normal pregnancy, unspecified trimester: Secondary | ICD-10-CM

## 2018-02-19 DIAGNOSIS — R252 Cramp and spasm: Secondary | ICD-10-CM

## 2018-02-19 DIAGNOSIS — O9989 Other specified diseases and conditions complicating pregnancy, childbirth and the puerperium: Secondary | ICD-10-CM

## 2018-02-19 DIAGNOSIS — Z3483 Encounter for supervision of other normal pregnancy, third trimester: Secondary | ICD-10-CM

## 2018-02-19 DIAGNOSIS — G4700 Insomnia, unspecified: Secondary | ICD-10-CM

## 2018-02-19 MED ORDER — MAGNESIUM 200 MG PO TABS: 2.0000 | ORAL_TABLET | Freq: Every day | ORAL | 1 refills | Status: DC

## 2018-02-19 NOTE — Patient Instructions (Signed)
OTC (available at [employer]) GABA 750mg  by mouth at bedtime for insomnia

## 2018-02-19 NOTE — Progress Notes (Signed)
   PRENATAL VISIT NOTE  Subjective:  Kathleen Sparks is a 28 y.o. G2P1001 at [redacted]w[redacted]d being seen today for ongoing prenatal care.  She is currently monitored for the following issues for this low-risk pregnancy and has PANIC DISORDER; TREMOR; Chlamydia infection; Candidal vaginitis; UTI (urinary tract infection); Uterine size date discrepancy; Supervision of other normal pregnancy, antepartum; Round ligament pain; Positive fetal fibronectin at 22 weeks to [redacted] weeks gestation; and Preterm contractions on their problem list.  Patient reports continued mild round ligament pain and difficulty sleeping due to discomfort and mental overactivity. Taking Procardia as prescribed, able to do a light exercise class without increased contractions/uterine activity.  Contractions: Not present. Vag. Bleeding: None.  Movement: Present. Denies leaking of fluid.   The following portions of the patient's history were reviewed and updated as appropriate: allergies, current medications, past family history, past medical history, past social history, past surgical history and problem list. Problem list updated.  Objective:   Vitals:   02/19/18 0946  BP: 131/78  Pulse: 93  Weight: 78.7 kg    Fetal Status: Fetal Heart Rate (bpm): 160 Fundal Height: 35 cm Movement: Present     General:  Alert, oriented and cooperative. Patient is in no acute distress.  Skin: Skin is warm and dry. No rash noted.   Cardiovascular: Normal heart rate noted  Respiratory: Normal respiratory effort, no problems with respiration noted  Abdomen: Soft, gravid, appropriate for gestational age.  Pain/Pressure: Absent     Pelvic: Cervical exam deferred        Extremities: Normal range of motion.  Edema: None  Mental Status: Normal mood and affect. Normal behavior. Normal judgment and thought content.   Assessment and Plan:  Pregnancy: G2P1001 at [redacted]w[redacted]d  1. Supervision of other normal pregnancy, antepartum  2. Insomnia, unspecified type -  Suggested OTC GABA supplement for insomnia related to mental activity  3. Leg cramps in pregnancy - Magnesium 200 MG TABS; Take 2 tablets (400 mg total) by mouth daily.  Dispense: 60 each; Refill: 1  Preterm labor symptoms and general obstetric precautions including but not limited to vaginal bleeding, contractions, leaking of fluid and fetal movement were reviewed in detail with the patient. Please refer to After Visit Summary for other counseling recommendations.  Return in about 1 week (around 02/26/2018) for ROB.  Future Appointments  Date Time Provider Department Center  02/27/2018 11:10 AM Raelyn Mora, CNM [facility] None  03/05/2018  9:30 AM Raelyn Mora, CNM [facility] None  03/12/2018  9:10 AM Raelyn Mora, CNM [facility] None  03/19/2018  9:30 AM Raelyn Mora, CNM [facility] None    Bernerd Limbo, Student-MidWife

## 2018-02-26 ENCOUNTER — Encounter: Payer: Self-pay | Admitting: Obstetrics and Gynecology

## 2018-02-27 ENCOUNTER — Other Ambulatory Visit: Payer: Self-pay

## 2018-02-27 ENCOUNTER — Ambulatory Visit (INDEPENDENT_AMBULATORY_CARE_PROVIDER_SITE_OTHER): Payer: Managed Care, Other (non HMO) | Admitting: Obstetrics and Gynecology

## 2018-02-27 VITALS — BP 120/73 | HR 81 | Wt 176.2 lb

## 2018-02-27 DIAGNOSIS — Z3A36 36 weeks gestation of pregnancy: Secondary | ICD-10-CM

## 2018-02-27 DIAGNOSIS — Z3480 Encounter for supervision of other normal pregnancy, unspecified trimester: Secondary | ICD-10-CM

## 2018-03-02 NOTE — Progress Notes (Signed)
   PRENATAL VISIT NOTE  Subjective:  Kathleen Sparks is a 28 y.o. G2P1001 at [redacted]w[redacted]d being seen today for ongoing prenatal care.  She is currently monitored for the following issues for this low-risk pregnancy and has PANIC DISORDER; TREMOR; Chlamydia infection; Candidal vaginitis; UTI (urinary tract infection); Uterine size date discrepancy; Supervision of other normal pregnancy, antepartum; Round ligament pain; Positive fetal fibronectin at 22 weeks to [redacted] weeks gestation; and Preterm contractions on their problem list.  Patient reports no complaints.  Contractions: Not present. Vag. Bleeding: None.  Movement: Present. Denies leaking of fluid.   The following portions of the patient's history were reviewed and updated as appropriate: allergies, current medications, past family history, past medical history, past social history, past surgical history and problem list. Problem list updated.  Objective:   Vitals:   02/27/18 1118  BP: 120/73  Pulse: 81  Weight: 176 lb 3.2 oz (79.9 kg)    Fetal Status: Fetal Heart Rate (bpm): 156 Fundal Height: 35 cm Movement: Present     General:  Alert, oriented and cooperative. Patient is in no acute distress.  Skin: Skin is warm and dry. No rash noted.   Cardiovascular: Normal heart rate noted  Respiratory: Normal respiratory effort, no problems with respiration noted  Abdomen: Soft, gravid, appropriate for gestational age.  Pain/Pressure: Present     Pelvic: Cervical exam deferred        Extremities: Normal range of motion.  Edema: None  Mental Status: Normal mood and affect. Normal behavior. Normal judgment and thought content.   Assessment and Plan:  Pregnancy: G2P1001 at [redacted]w[redacted]d  1. Supervision of other normal pregnancy, antepartum - Planning to bring birth plan to [place] - May hire a doula or just have her friend (who is a doula) there as support person in labor - Discussed EPO, RRTL now and Blue Cohosh after 37 wks to help promote labor and cervical  ripening.  Preterm labor symptoms and general obstetric precautions including but not limited to vaginal bleeding, contractions, leaking of fluid and fetal movement were reviewed in detail with the patient. Please refer to After Visit Summary for other counseling recommendations.  Return in about 1 week (around 03/06/2018) for Return OB visit.  Future Appointments  Date Time Provider Department Center  03/05/2018  9:30 AM Raelyn Mora, CNM [facility] None  03/12/2018  9:10 AM Raelyn Mora, CNM [facility] None  03/19/2018  9:30 AM Raelyn Mora, CNM [facility] None    Raelyn Mora, CNM

## 2018-03-05 ENCOUNTER — Other Ambulatory Visit: Payer: Self-pay

## 2018-03-05 ENCOUNTER — Ambulatory Visit (INDEPENDENT_AMBULATORY_CARE_PROVIDER_SITE_OTHER): Payer: Managed Care, Other (non HMO) | Admitting: Obstetrics and Gynecology

## 2018-03-05 ENCOUNTER — Encounter: Payer: Self-pay | Admitting: Obstetrics and Gynecology

## 2018-03-05 VITALS — BP 116/73 | HR 88 | Wt 183.0 lb

## 2018-03-05 DIAGNOSIS — Z3A37 37 weeks gestation of pregnancy: Secondary | ICD-10-CM

## 2018-03-05 DIAGNOSIS — R102 Pelvic and perineal pain: Secondary | ICD-10-CM

## 2018-03-05 DIAGNOSIS — Z3483 Encounter for supervision of other normal pregnancy, third trimester: Secondary | ICD-10-CM

## 2018-03-05 DIAGNOSIS — O26893 Other specified pregnancy related conditions, third trimester: Secondary | ICD-10-CM

## 2018-03-05 DIAGNOSIS — Z3480 Encounter for supervision of other normal pregnancy, unspecified trimester: Secondary | ICD-10-CM

## 2018-03-05 NOTE — Progress Notes (Signed)
   PRENATAL VISIT NOTE  Subjective:  Kathleen Sparks is a 28 y.o. G2P1001 at [redacted]w[redacted]d being seen today for ongoing prenatal care.  She is currently monitored for the following issues for this low-risk pregnancy and has PANIC DISORDER; TREMOR; Chlamydia infection; Candidal vaginitis; UTI (urinary tract infection); Uterine size date discrepancy; Supervision of other normal pregnancy, antepartum; Round ligament pain; Positive fetal fibronectin at 22 weeks to [redacted] weeks gestation; and Preterm contractions on their problem list.  Patient reports continued pelvic pressure. Stopped taking Procardia 2 days ago. Started taking the EPO & RRLT.  Contractions: Not present. Vag. Bleeding: None.  Movement: Present. Denies leaking of fluid.   The following portions of the patient's history were reviewed and updated as appropriate: allergies, current medications, past family history, past medical history, past social history, past surgical history and problem list. Problem list updated.  Objective:   Vitals:   03/05/18 1028  BP: 116/73  Pulse: 88  Weight: 183 lb (83 kg)    Fetal Status: Fetal Heart Rate (bpm): 154 Fundal Height: 37 cm Movement: Present     General:  Alert, oriented and cooperative. Patient is in no acute distress.  Skin: Skin is warm and dry. No rash noted.   Cardiovascular: Normal heart rate noted  Respiratory: Normal respiratory effort, no problems with respiration noted  Abdomen: Soft, gravid, appropriate for gestational age.  Pain/Pressure: Present     Pelvic: Cervical exam deferred        Extremities: Normal range of motion.  Edema: None  Mental Status: Normal mood and affect. Normal behavior. Normal judgment and thought content.   Assessment and Plan:  Pregnancy: G2P1001 at [redacted]w[redacted]d Supervision of other normal pregnancy, antepartum - Plans to discuss labor plans with doula friend later today - Will bring birth plan [place] for review with CNM - Continue EPO & RRLT; add in [facility] Cohosh @  38-[redacted] wks gestation  Pelvic pain affecting pregnancy in third trimester, antepartum   Preterm labor symptoms and general obstetric precautions including but not limited to vaginal bleeding, contractions, leaking of fluid and fetal movement were reviewed in detail with the patient. Please refer to After Visit Summary for other counseling recommendations.  Return in about 1 week (around 03/12/2018) for Return OB visit.  Future Appointments  Date Time Provider Department Center  03/12/2018  9:10 AM Raelyn Mora, CNM [facility] None  03/19/2018  9:30 AM Raelyn Mora, CNM [facility] None    Raelyn Mora, CNM

## 2018-03-12 ENCOUNTER — Ambulatory Visit (INDEPENDENT_AMBULATORY_CARE_PROVIDER_SITE_OTHER): Payer: Managed Care, Other (non HMO) | Admitting: Obstetrics and Gynecology

## 2018-03-12 VITALS — BP 119/80 | HR 84 | Wt 181.6 lb

## 2018-03-12 DIAGNOSIS — Z3A38 38 weeks gestation of pregnancy: Secondary | ICD-10-CM

## 2018-03-12 DIAGNOSIS — Z3483 Encounter for supervision of other normal pregnancy, third trimester: Secondary | ICD-10-CM

## 2018-03-12 NOTE — Progress Notes (Signed)
   PRENATAL VISIT NOTE  Subjective:  Kathleen Sparks is a 28 y.o. G2P1001 at [redacted]w[redacted]d being seen today for ongoing prenatal care.  She is currently monitored for the following issues for this low-risk pregnancy and has PANIC DISORDER; TREMOR; Chlamydia infection; Candidal vaginitis; UTI (urinary tract infection); Uterine size date discrepancy; Supervision of other normal pregnancy, antepartum; Round ligament pain; Positive fetal fibronectin at 22 weeks to [redacted] weeks gestation; and Preterm contractions on their problem list.  Patient reports backache and wanted to go over questions for her birth plan, will bring a copy to her next visit. Has hired a doula [employer].).  Contractions: Irregular. Vag. Bleeding: None.  Movement: Present. Denies leaking of fluid.   The following portions of the patient's history were reviewed and updated as appropriate: allergies, current medications, past family history, past medical history, past social history, past surgical history and problem list. Problem list updated.  Objective:   Vitals:   03/12/18 1010  BP: 119/80  Pulse: 84  Weight: 82.4 kg    Fetal Status: Fetal Heart Rate (bpm): 141 Fundal Height: 38 cm Movement: Present    Baby ROT via Leopold's maneuvers, not engaged yet  General:  Alert, oriented and cooperative. Patient is in no acute distress.  Skin: Skin is warm and dry. No rash noted.   Cardiovascular: Normal heart rate noted  Respiratory: Normal respiratory effort, no problems with respiration noted  Abdomen: Soft, gravid, appropriate for gestational age.  Pain/Pressure: Present     Pelvic: Cervical exam deferred        Extremities: Normal range of motion.  Edema: None  Mental Status: Normal mood and affect. Normal behavior. Normal judgment and thought content.   Assessment and Plan:  Pregnancy: G2P1001 at [redacted]w[redacted]d  1. Encounter for supervision of other normal pregnancy in third trimester - Reviewed standard procedures at [facility]: delayed  cord clamping, immediate skin-to-skin, rooming in, postpartum infant meds, fetal monitoring, and Cesarean indications - Also discussed non-epidural pain relief methods - Given [employer] with instructions to use if she has excessive back pressure or back contractions. Encouraged her to stretch daily and continue wearing her maternity belt  Term labor symptoms and general obstetric precautions including but not limited to vaginal bleeding, contractions, leaking of fluid and fetal movement were reviewed in detail with the patient. Please refer to After Visit Summary for other counseling recommendations.  Return in about 1 week (around 03/19/2018) for ROB.  Future Appointments  Date Time Provider Department Center  03/19/2018  9:30 AM Raelyn Mora, CNM [facility] None    Bernerd Limbo, Student-MidWife

## 2018-03-19 ENCOUNTER — Other Ambulatory Visit: Payer: Self-pay

## 2018-03-19 ENCOUNTER — Ambulatory Visit (INDEPENDENT_AMBULATORY_CARE_PROVIDER_SITE_OTHER): Payer: Managed Care, Other (non HMO) | Admitting: Obstetrics and Gynecology

## 2018-03-19 VITALS — BP 125/73 | HR 111 | Wt 181.2 lb

## 2018-03-19 DIAGNOSIS — Z3A39 39 weeks gestation of pregnancy: Secondary | ICD-10-CM

## 2018-03-19 DIAGNOSIS — Z3483 Encounter for supervision of other normal pregnancy, third trimester: Secondary | ICD-10-CM

## 2018-03-19 NOTE — Progress Notes (Signed)
   PRENATAL VISIT NOTE  Subjective:  Kathleen Sparks is a 28 y.o. G2P1001 at [redacted]w[redacted]d being seen today for ongoing prenatal care.  She is currently monitored for the following issues for this low-risk pregnancy and has PANIC DISORDER; TREMOR; Chlamydia infection; Candidal vaginitis; UTI (urinary tract infection); Uterine size date discrepancy; Supervision of other normal pregnancy, antepartum; Round ligament pain; Positive fetal fibronectin at 22 weeks to [redacted] weeks gestation; and Preterm contractions on their problem list.  Patient reports no complaints.  Contractions: Irregular. Vag. Bleeding: None.  Movement: Present. Denies leaking of fluid.   The following portions of the patient's history were reviewed and updated as appropriate: allergies, current medications, past family history, past medical history, past social history, past surgical history and problem list. Problem list updated.  Objective:   Vitals:   03/19/18 0955  BP: 125/73  Pulse: (!) 111  Weight: 82.2 kg    Fetal Status: Fetal Heart Rate (bpm): 153 Fundal Height: 39 cm Movement: Present     General:  Alert, oriented and cooperative. Patient is in no acute distress.  Skin: Skin is warm and dry. No rash noted.   Cardiovascular: Normal heart rate noted  Respiratory: Normal respiratory effort, no problems with respiration noted  Abdomen: Soft, gravid, appropriate for gestational age.  Pain/Pressure: Present     Pelvic: Cervical exam deferred        Extremities: Normal range of motion.  Edema: Trace  Mental Status: Normal mood and affect. Normal behavior. Normal judgment and thought content.   Assessment and Plan:  Pregnancy: G2P1001 at [redacted]w[redacted]d  1. Encounter for supervision of other normal pregnancy in third trimester - Reviewed birth plan and made suggestions based on [facility] standard of care. She would prefer a midwife Market researcher is ok) if possible - Anticipatory guidance given on potential IOL for post-dates, she  prefers to wait until 42wks if possible  Term labor symptoms and general obstetric precautions including but not limited to vaginal bleeding, contractions, leaking of fluid and fetal movement were reviewed in detail with the patient. Please refer to After Visit Summary for other counseling recommendations.  Return in about 1 week (around 03/26/2018) for ROB.  Future Appointments  Date Time Provider Department Center  03/27/2018 10:50 AM Judeth Horn, NP [facility] None    Bernerd Limbo, Student-MidWife

## 2018-03-23 ENCOUNTER — Encounter: Payer: Self-pay | Admitting: General Practice

## 2018-03-25 ENCOUNTER — Other Ambulatory Visit: Payer: Self-pay

## 2018-03-25 ENCOUNTER — Encounter (HOSPITAL_COMMUNITY): Payer: Self-pay

## 2018-03-25 ENCOUNTER — Inpatient Hospital Stay (HOSPITAL_COMMUNITY)
Admission: AD | Admit: 2018-03-25 | Discharge: 2018-03-26 | DRG: 807 | Disposition: A | Discharge status: Home / Self Care | Payer: Managed Care, Other (non HMO) | Attending: Family Medicine | Admitting: Family Medicine

## 2018-03-25 DIAGNOSIS — Z880 Allergy status to penicillin: Secondary | ICD-10-CM

## 2018-03-25 DIAGNOSIS — O26893 Other specified pregnancy related conditions, third trimester: Secondary | ICD-10-CM | POA: Diagnosis present

## 2018-03-25 DIAGNOSIS — Z3A40 40 weeks gestation of pregnancy: Secondary | ICD-10-CM

## 2018-03-25 LAB — HEPATITIS B SURFACE ANTIGEN: Hepatitis B Surface Ag: NEGATIVE

## 2018-03-25 MED ORDER — DIBUCAINE 1 % RE OINT: 1.0000 "application " | TOPICAL_OINTMENT | RECTAL | Status: DC | PRN

## 2018-03-25 MED ORDER — ONDANSETRON HCL 4 MG/2ML IJ SOLN: 4.0000 mg | INTRAMUSCULAR | Status: DC | PRN

## 2018-03-25 MED ORDER — PRENATAL MULTIVITAMIN CH
1.0000 | ORAL_TABLET | Freq: Every day | ORAL | Status: DC
  Administered 2018-03-26: 1 via ORAL
  Filled 2018-03-25: qty 1

## 2018-03-25 MED ORDER — SIMETHICONE 80 MG PO CHEW: 80.0000 mg | CHEWABLE_TABLET | ORAL | Status: DC | PRN

## 2018-03-25 MED ORDER — OXYTOCIN 10 UNIT/ML IJ SOLN
INTRAMUSCULAR | Status: AC
  Administered 2018-03-25: 10 [IU] via INTRAMUSCULAR
  Filled 2018-03-25: qty 1

## 2018-03-25 MED ORDER — IBUPROFEN 600 MG PO TABS
600.0000 mg | ORAL_TABLET | Freq: Four times a day (QID) | ORAL | Status: DC
  Administered 2018-03-26: 600 mg via ORAL
  Filled 2018-03-25 (×4): qty 1

## 2018-03-25 MED ORDER — OXYCODONE HCL 5 MG PO TABS: 10.0000 mg | ORAL_TABLET | ORAL | Status: DC | PRN

## 2018-03-25 MED ORDER — ACETAMINOPHEN 325 MG PO TABS: 650.0000 mg | ORAL_TABLET | ORAL | Status: DC | PRN

## 2018-03-25 MED ORDER — DIPHENHYDRAMINE HCL 25 MG PO CAPS: 25.0000 mg | ORAL_CAPSULE | Freq: Four times a day (QID) | ORAL | Status: DC | PRN

## 2018-03-25 MED ORDER — COCONUT OIL OIL: 1.0000 "application " | TOPICAL_OIL | Status: DC | PRN

## 2018-03-25 MED ORDER — SENNOSIDES-DOCUSATE SODIUM 8.6-50 MG PO TABS
2.0000 | ORAL_TABLET | ORAL | Status: DC
  Administered 2018-03-26: 2 via ORAL
  Filled 2018-03-25: qty 2

## 2018-03-25 MED ORDER — BENZOCAINE-MENTHOL 20-0.5 % EX AERO
1.0000 "application " | INHALATION_SPRAY | CUTANEOUS | Status: DC | PRN
  Administered 2018-03-25: 1 via TOPICAL
  Filled 2018-03-25: qty 56

## 2018-03-25 MED ORDER — ONDANSETRON HCL 4 MG PO TABS: 4.0000 mg | ORAL_TABLET | ORAL | Status: DC | PRN

## 2018-03-25 MED ORDER — OXYCODONE HCL 5 MG PO TABS: 5.0000 mg | ORAL_TABLET | ORAL | Status: DC | PRN

## 2018-03-25 MED ORDER — WITCH HAZEL-GLYCERIN EX PADS: 1.0000 "application " | MEDICATED_PAD | CUTANEOUS | Status: DC | PRN
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
## 2018-03-26 LAB — RUBELLA SCREEN: RUBELLA: 1.55 {index} (ref >0.99)

## 2018-03-26 LAB — RPR: RPR: NONREACTIVE

## 2018-03-26 MED ORDER — SENNOSIDES-DOCUSATE SODIUM 8.6-50 MG PO TABS: 2.0000 | ORAL_TABLET | Freq: Every evening | ORAL | 0 refills | Status: DC | PRN

## 2018-03-26 MED ORDER — IBUPROFEN 800 MG PO TABS: 800.0000 mg | ORAL_TABLET | Freq: Three times a day (TID) | ORAL | 0 refills | Status: AC

## 2018-03-26 NOTE — Lactation Note (Signed)
This note was copied from a baby's chart. Lactation Consultation Note  Patient Name: Kathleen Sparks GGYIR'S Date: 03/26/2018 Reason for consult: Follow-up assessment;Term;Infant weight loss(6% weight loss )  Baby is 26 hours old  LC reviewed and updated the doc flow sheets per mom.  Per mom breast feeding and latching is going well especially after the previous LC gave me some  Pointers.  Mom denies sore nipples . Sore nipple and engorgement prevention and tx reviewed.  Per mom has hand pump and DEBP at home.  LC reviewed basics sine it has been 5 years and the importance of releasing 2nd breast down if the baby  only feeds the 1st breast . Discussed nutritive feeding vs non - nutritive feeding pattern/ and watch for hanging out latched.  STS feedings until the baby is back to birth weight.  Mother informed of post-discharge support and given phone number to the lactation department, including services for phone call assistance; out-patient appointments; and breastfeeding support group. List of other breastfeeding resources in the community given in the handout. Encouraged mother to call for problems or concerns related to breastfeeding.   Maternal Data Has patient been taught Hand Expression?: Yes(per mom )  Feeding Feeding Type: (per mom last fed at 1055 )  LATCH Score                   Interventions Interventions: Breast feeding basics reviewed  Lactation Tools Discussed/Used Pump Review: Setup, frequency, and cleaning;Milk Storage Initiated by:: MAI / reviewed  Date initiated:: 03/26/18   Consult Status Consult Status: Complete Date: 03/26/18    Kathleen Sparks 03/26/2018, 11:45 AM

## 2018-03-26 NOTE — Lactation Note (Signed)
This note was copied from a baby's chart. Lactation Consultation Note  Patient Name: Kathleen Sparks GYFVC'B Date: 03/26/2018 Reason for consult: Initial assessment;Term P2, 15 hour female infant. Per mom, infant had 2 stools unsure of wet diapers. LC explained to mom how to tell if diaper is wet. Per mom, she has DEBP at home. Mom knows how to hand express. Mom latched infant on right breast using cross cradle hold, LC ask mom wait until infant's mouth is wide with tongue down, infant latched chin first with nose touching breast, few swallows observed. Infant was  breastfeeding for 8 minutes and was still breastfeeding when LC left the room. Mom knows to breastfeed according hunger cues, 8 or more times within 24 hours. LC discussed I & O. Reviewed Baby & Me book's Breastfeeding Basics.  Mom made aware of O/P services, breastfeeding support groups, community resources, and our phone # for post-discharge questions.   Maternal Data Formula Feeding for Exclusion: No Has patient been taught Hand Expression?: Yes Does the patient have breastfeeding experience prior to this delivery?: Yes  Feeding Feeding Type: Breast Milk  LATCH Score Latch: Grasps breast easily, tongue down, lips flanged, rhythmical sucking.  Audible Swallowing: A few with stimulation  Type of Nipple: Everted at rest and after stimulation  Comfort (Breast/Nipple): Soft / non-tender  Hold (Positioning): Assistance needed to correctly position infant at breast and maintain latch.  LATCH Score: 8  Interventions Interventions: Breast feeding basics reviewed;Assisted with latch;Skin to skin;Breast massage;Hand express;Support pillows;Adjust position;Breast compression;Position options  Lactation Tools Discussed/Used WIC Program: No   Consult Status Consult Status: Follow-up Date: 03/27/18 Follow-up type: In-patient    Danelle Earthly 03/26/2018, 12:33 AM

## 2018-03-26 NOTE — Discharge Summary (Signed)
Obstetrics Discharge Summary OB/GYN Faculty [facility]   Patient Name: Kathleen Sparks DOB: January 02, 1991 MRN: [id number]  Date of admission: 03/25/2018 Delivering MD: Raelyn Mora   Date of discharge: 03/26/2018  Admitting diagnosis:  CTX, water broke Intrauterine pregnancy: [redacted]w[redacted]d     Secondary diagnosis:   Active Problems:   Normal labor    Discharge diagnosis: Term Pregnancy Delivered                                            Postpartum procedures: None  Complications: meconium stained fluid with delivery  Outpatient Follow-Up: [ ]  continue to counsel on method of contraception - undecided at discharge   Hospital course: Issabella Seres is a 28 y.o. [redacted]w[redacted]d who was admitted for spontaneous onset of labor and SROM. Her pregnancy was uncomplicated. Her labor course was notable for precipitous delivery, meconium-stained fluid. Delivery was otherwise uncomplicated without lacerations. Please see delivery/op note for additional details. Her postpartum course was uncomplicated. She was breastfeeding without difficulty. By day of discharge, she was passing flatus, urinating, eating and drinking without difficulty. Her pain was well-controlled, and she was discharged home with ibuprofen. She will follow-up in clinic in 4-6 weeks.   Physical exam  Vitals:   03/25/18 1733 03/25/18 2115 03/26/18 0046 03/26/18 0531  BP: 119/73 108/67 123/81 113/78  Pulse: 67 71 71 71  Resp: 17 18 18 18   Temp: 99.3 F (37.4 C) 98.4 F (36.9 C) 98 F (36.7 C) 98.3 F (36.8 C)  TempSrc: Oral Oral Oral Oral  SpO2: 100%      General: well-appearing, NAD, sitting up in bed Lochia: appropriate Uterine Fundus: firm Incision: N/A DVT Evaluation: No significant calf/ankle edema  Labs: Lab Results  Component Value Date   WBC 13.0 (H) 12/25/2017   HGB 12.1 12/25/2017   HCT 34.9 12/25/2017   MCV 90 12/25/2017   PLT 192 12/25/2017   No flowsheet data found.  Discharge instructions: Per After Visit Summary  and "Baby and Me Booklet"  After visit meds:  Allergies as of 03/26/2018   No Known Allergies     Medication List    STOP taking these medications   Comfort Fit Maternity Supp Sm Misc   Evening Primrose Oil 500 MG Caps   Magnesium 200 MG Tabs   NIFEdipine 30 MG 24 hr tablet Commonly known as:  Procardia XL     TAKE these medications   ibuprofen 800 MG tablet Commonly known as:  ADVIL,MOTRIN Take 1 tablet (800 mg total) by mouth 3 (three) times daily.   PrePLUS 27-1 MG Tabs Take 1 tablet by mouth daily.   senna-docusate 8.6-50 MG tablet Commonly known as:  Senokot-S Take 2 tablets by mouth at bedtime as needed for mild constipation.      Postpartum contraception: Undecided Diet: Routine Diet Activity: Advance as tolerated. Pelvic rest for 6 weeks.   Follow-up Appt: Future Appointments  Date Time Provider Department Center  05/07/2018 10:30 AM Raelyn Mora, CNM [facility] None   Newborn Data: Live born female  Birth Weight: 6 lb 11.1 oz (3035 g) APGAR: 9, 9  Newborn Delivery   Birth date/time:  03/25/2018 09:25:00 Delivery type:  Vaginal, Spontaneous    Baby Feeding: Breast Disposition:home with mother  Cristal Deer. Earlene Plater, DO OB/GYN Fellow, Faculty Practice

## 2018-03-26 NOTE — Progress Notes (Signed)
MOB was referred for history of depression/anxiety.  * Referral screened out by Clinical Social Worker because none of the following criteria appear to apply:  -History of anxiety/depression during this pregnancy, or of post-partum depression following prior delivery. -Diagnosis of anxiety and/or depression within last 3 years. Per chart review, MOB diagnosed in 2008.  OR * MOB's symptoms currently being treated with medication and/or therapy.  Please contact the Clinical Social Worker if needs arise, by [facility] request, or if MOB scores greater than 9/yes to question 10 on Edinburgh Postpartum Depression Screen.  Archie Balboa, LCSWA  [facility] [phone]

## 2018-03-27 ENCOUNTER — Encounter: Payer: Self-pay | Admitting: Student

## 2018-05-04 ENCOUNTER — Telehealth: Payer: Self-pay | Admitting: *Deleted

## 2018-05-04 NOTE — Telephone Encounter (Signed)
Patient called this morning regarding appointment on Thursday 05/07/2018.  Patient has not decided on what type of IUD at this time. Patient is aware she need a PAP. Patient stated she will have PP visit via telehealth and reschedule PAP and IUD insertion at a later date.  Clovis Pu, RN

## 2018-05-04 NOTE — Telephone Encounter (Signed)
-----   Message from Kathleen Sparks, [place] sent at 05/02/2018  1:22 PM EDT ----- Regarding: PP Visit She last had a pap 03/13/2012. We need to find out what type of birth control she wants. If she wants a LARC, we need to have her come in so we can do them both.  Thanks, Ro

## 2018-05-07 ENCOUNTER — Other Ambulatory Visit: Payer: Self-pay

## 2018-05-07 ENCOUNTER — Ambulatory Visit (INDEPENDENT_AMBULATORY_CARE_PROVIDER_SITE_OTHER): Payer: Managed Care, Other (non HMO) | Admitting: Obstetrics and Gynecology

## 2018-05-07 ENCOUNTER — Encounter: Payer: Self-pay | Admitting: Obstetrics and Gynecology

## 2018-05-07 DIAGNOSIS — Z1389 Encounter for screening for other disorder: Secondary | ICD-10-CM | POA: Diagnosis not present

## 2018-05-07 MED ORDER — PREPLUS 27-1 MG PO TABS: 1.0000 | ORAL_TABLET | Freq: Every day | ORAL | 13 refills | Status: AC

## 2018-05-07 NOTE — Progress Notes (Signed)
     TELEHEALTH VIRTUAL POSTPARTUM VISIT ENCOUNTER NOTE  I connected with [name] on 05/07/18 at 10:30 AM EDT by Webex video at home and verified that I am speaking with the correct person using two identifiers.   I discussed the limitations, risks, security and privacy concerns of performing an evaluation and management service by telephone and the availability of in person appointments. I also discussed with the patient that there may be a patient responsible charge related to this service. The patient expressed understanding and agreed to proceed.   History:  Kathleen Sparks is a 28 y.o. G70P2002 female who presents for a postpartum visit. She is 6 weeks postpartum following a vaginal delivery. I have fully reviewed the prenatal and intrapartum course. The delivery was at 40 gestational weeks.  Anesthesia: none. Postpartum course has been uncomplicated. Baby's course has been uncomplicated. Baby is feeding by breastfeeding. Bleeding none; stopped about 2 wks ago. Bowel function is normal. Bladder function is normal. Patient is not sexually active. Contraception method choice is undecided. She is considering an IUD, but wants more information on the types. Postpartum depression screening: negative.      Past Medical History:  Diagnosis Date  . Ankle fracture 2001  . Anxiety 2008   SHORT TERM MED  . Infection 2009   UTI  . Infection 2011   CHLAMYDIA   Past Surgical History:  Procedure Laterality Date  . NO PAST SURGERIES     The following portions of the patient's history were reviewed and updated as appropriate: allergies, current medications, past family history, past medical history, past social history, past surgical history and problem list.   Health Maintenance:  Normal pap on 08/2017 at [facility] in [place].   Review of Systems:  Pertinent items noted in HPI and remainder of comprehensive ROS otherwise negative.  Physical Exam:  Physical exam deferred due to  nature of the encounter  Assessment and Plan:   Encounter for postpartum visit - Normal postpartum visit - Liletta IUD pamphlet mailed to patient -- per request   I discussed the assessment and treatment plan with the patient. The patient was provided an opportunity to ask questions and all were answered. The patient agreed with the plan and demonstrated an understanding of the instructions.   The patient was advised to call back or seek an in-person evaluation/go to the ED if the symptoms worsen or if the condition fails to improve as anticipated.  I provided 15 minutes of non-face-to-face time during this encounter.   Raelyn Mora, CNM [facility] [employer], [facility]

## 2019-02-08 IMAGING — US US MFM OB FOLLOW-UP
1 series · 14 of 28 positions shown · non-contrast
Comparison: none

[Series 1: us mfm ob follow-up · 36 acquisitions, 14 frames shown]
[im 2/36]
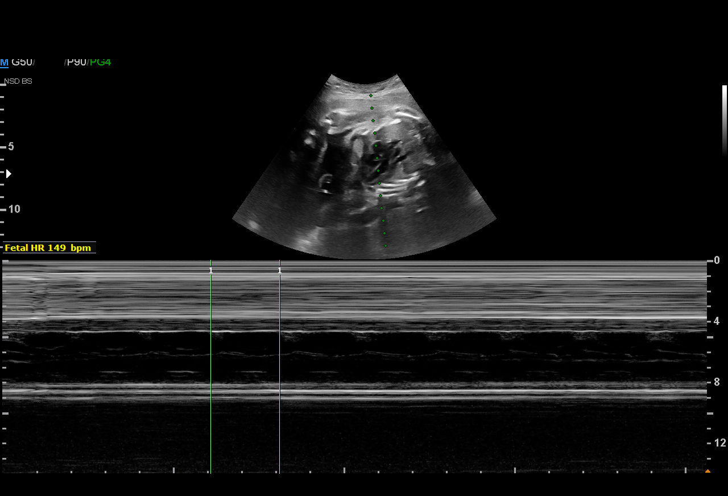
[im 4/36]
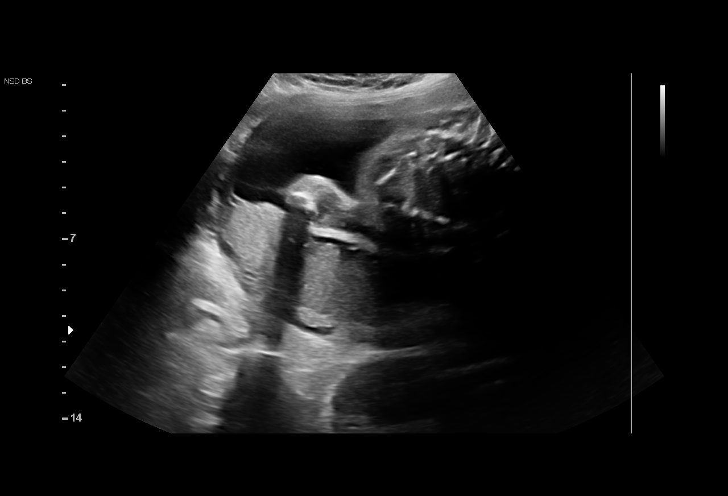
[im 7/36]
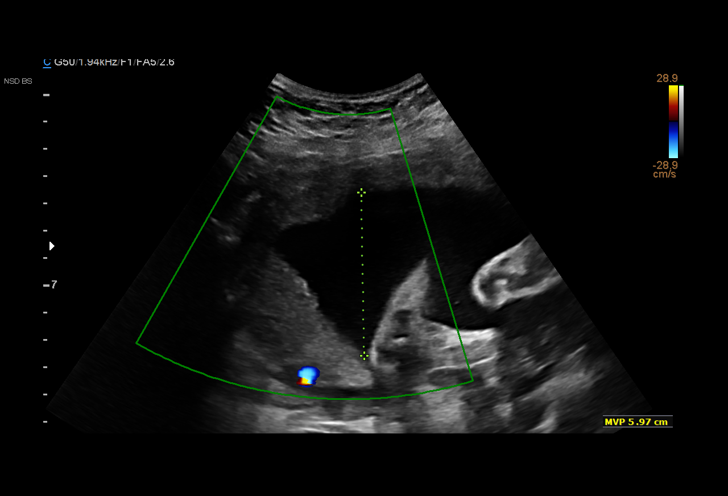
[im 10/36]
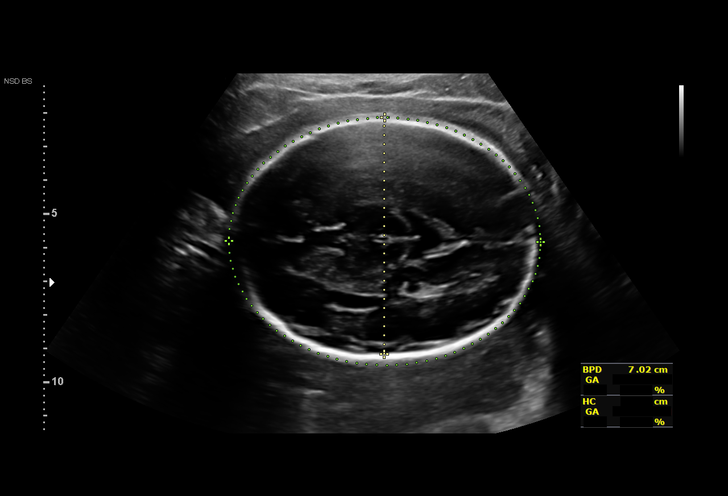
[im 12/36]
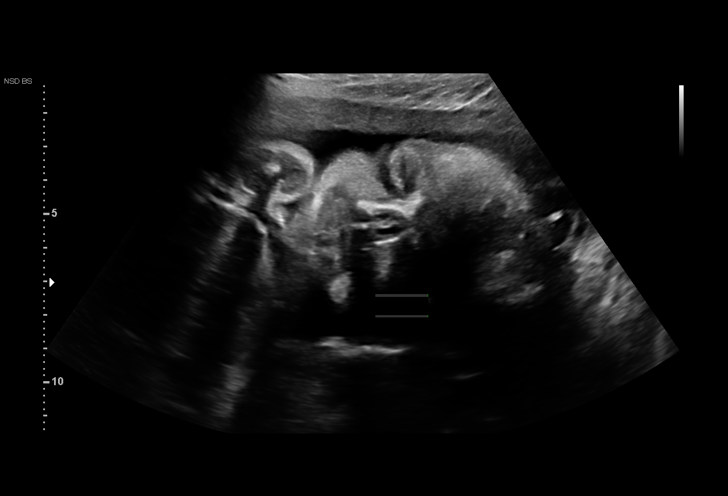
[im 15/36]
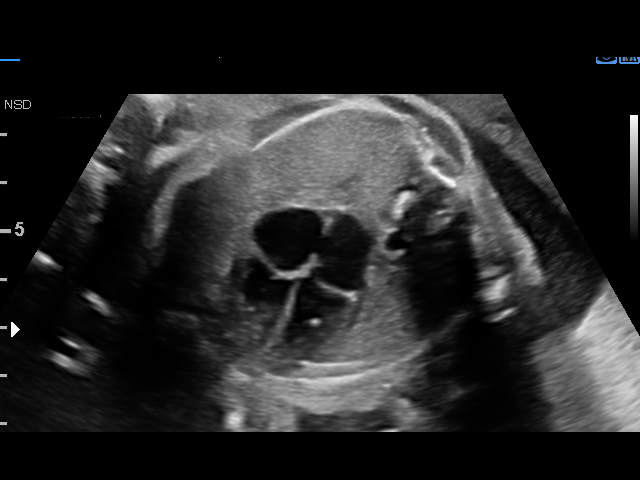
[im 17/36]
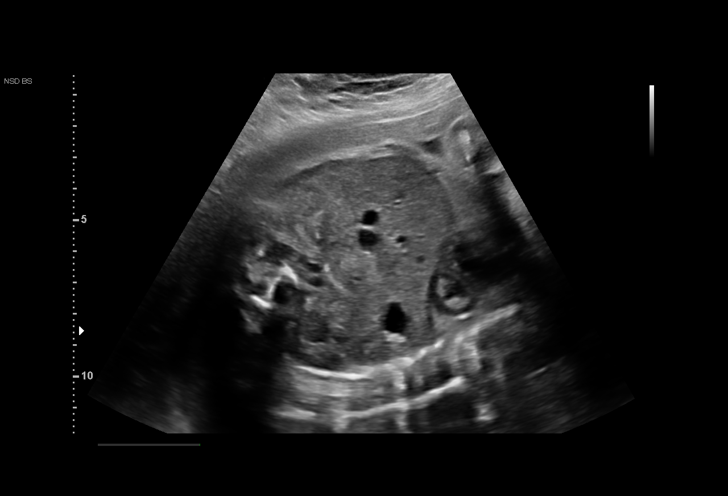
[im 20/36]
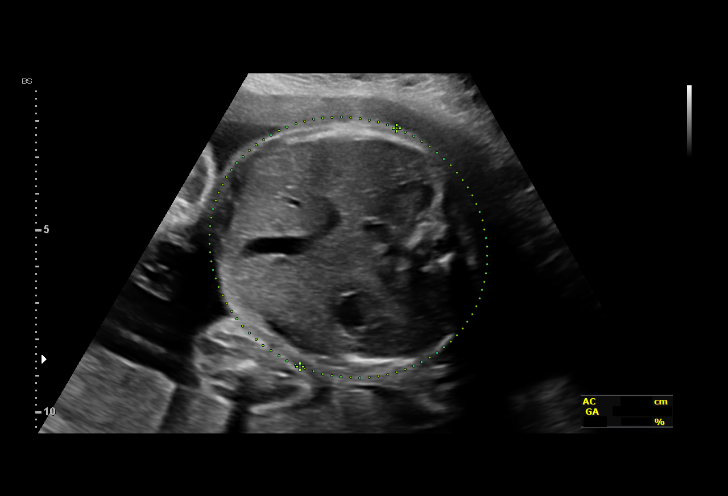
[im 23/36]
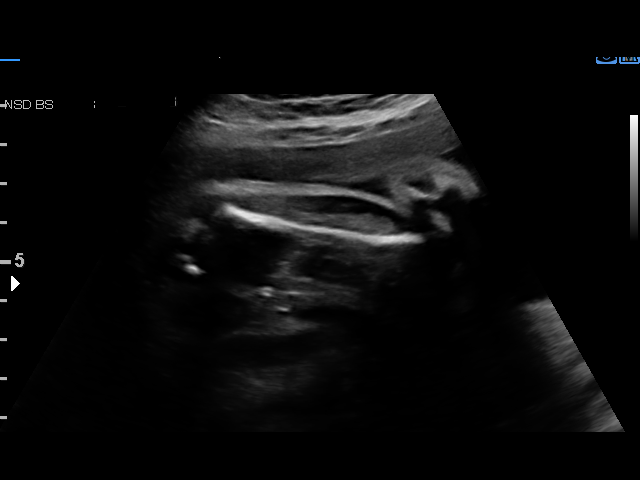
[im 25/36]
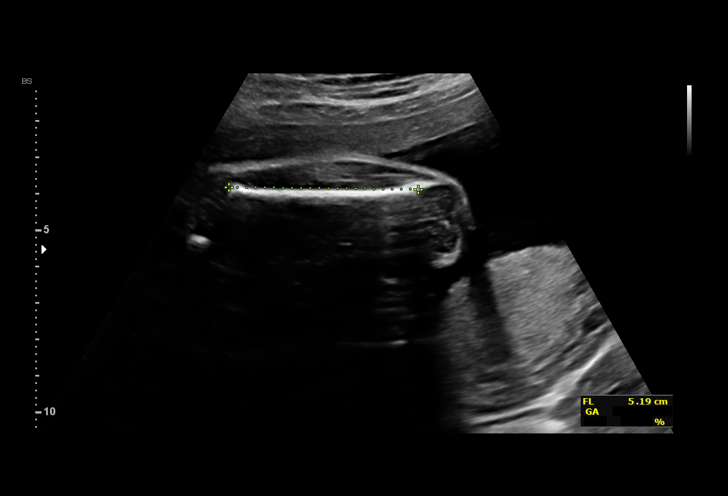
[im 28/36]
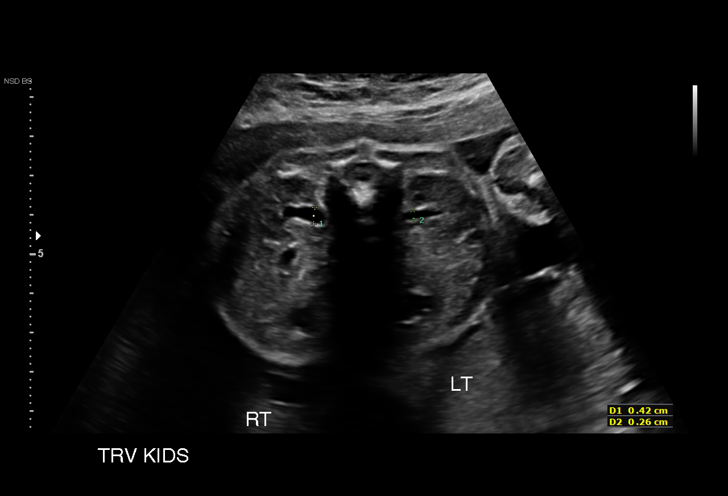
[im 30/36]
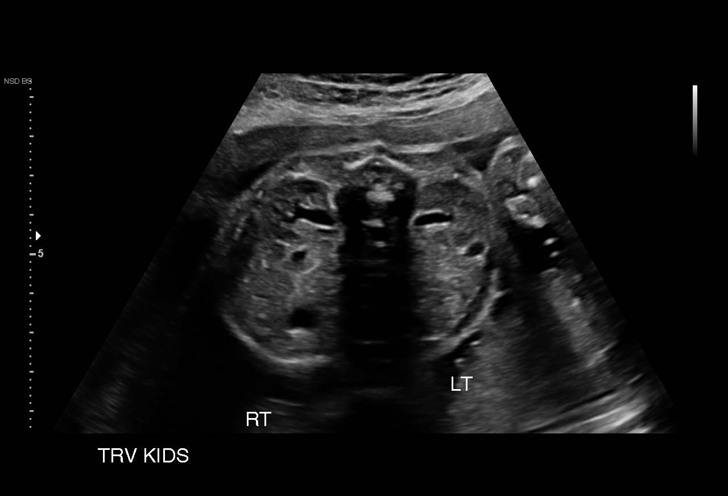
[im 33/36]
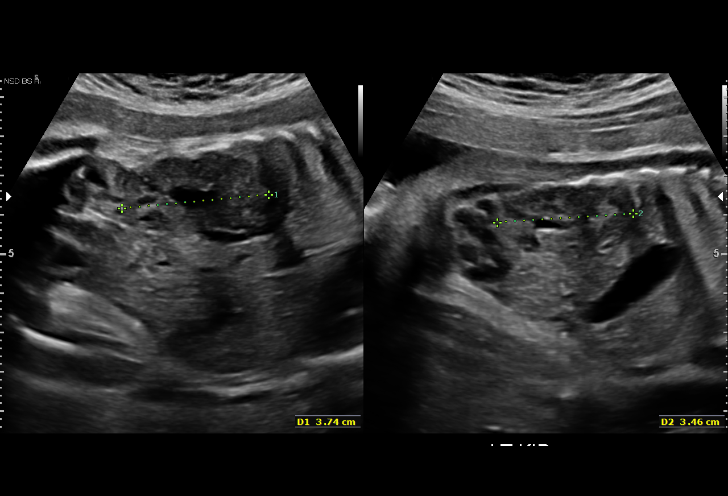
[im 36/36]
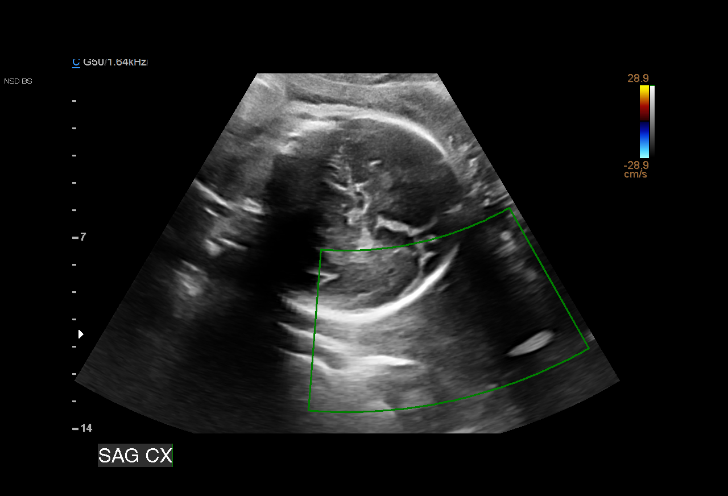

[14 of 28 positions shown; findings below may reference images not displayed]

[facility] [HOSPITAL]
                   GIULMAMEDOV CNM

 ----------------------------------------------------------------------

 ----------------------------------------------------------------------
Indications

  Encounter for antenatal screening for
  malformations (LOW risk NIPS, - AFP)
  28 weeks gestation of pregnancy
  Echogenic intracardiac focus of the heart
  (EIF)
  Pyelectasis of fetus on prenatal ultrasound
  Size-Date Discrepancy
 ----------------------------------------------------------------------
Vital Signs

                                                Height:        5'7"
Fetal Evaluation

 Num Of Fetuses:          1
 Fetal Heart Rate(bpm):   149
 Cardiac Activity:        Observed
 Presentation:            Cephalic
 Placenta:                Posterior
 P. Cord Insertion:       Previously seen as normal

 Amniotic Fluid
 AFI FV:      Within normal limits

                             Largest Pocket(cm)

Biometry

 BPD:      69.8  mm     G. Age:  28w 0d         39  %    CI:        73.05   %    70 - 86
                                                         FL/HC:       19.9  %    18.8 -
 HC:      259.6  mm     G. Age:  28w 2d         26  %    HC/AC:       1.12       1.05 -
 AC:      232.2  mm     G. Age:  27w 4d         30  %    FL/BPD:      73.9  %    71 - 87
 FL:       51.6  mm     G. Age:  27w 4d         23  %    FL/AC:       22.2  %    20 - 24
 LV:        2.2  mm

 Est. FW:    7741   gm     2 lb 7 oz     44  %
OB History

 Gravidity:    2         Term:   1
 Living:       1
Gestational Age

 LMP:           28w 0d        Date:  06/18/17                 EDD:   03/25/18
 U/S Today:     27w 6d                                        EDD:   03/26/18
 Best:          28w 0d     Det. By:  LMP  (06/18/17)          EDD:   03/25/18
Anatomy

 Cranium:               Appears normal         Aortic Arch:            Previously seen
 Cavum:                 Previously seen        Ductal Arch:            Previously seen
 Ventricles:            Appears normal         Diaphragm:              Appears normal
 Choroid Plexus:        Previously seen        Stomach:                Appears normal, left
                                                                       sided
 Cerebellum:            Previously seen        Abdomen:                Appears normal
 Posterior Fossa:       Previously seen        Abdominal Wall:         Previously seen
 Nuchal Fold:           Not applicable (>20    Cord Vessels:           Previously seen
                        wks GA)
 Face:                  Orbits and profile     Kidneys:                Appear normal
                        previously seen
 Lips:                  Previously seen        Bladder:                Appears normal
 Thoracic:              Appears normal         Spine:                  Previously seen
 Heart:                 Echogenic focus        Upper Extremities:      Previously seen
                        in LV
 RVOT:                  Previously seen        Lower Extremities:      Previously seen
 LVOT:                  Previously seen

 Other:  Female gender Heels, 5th digit, and Nasal bone previously visualized.
Cervix Uterus Adnexa

 Cervix
 Not visualized (advanced GA >59wks)

 Left Ovary
 Previously seen.

 Right Ovary
 Previously seen
Impression

 Normal interval growth.
 Bilateral renal pyelectasis has resolved.

Recommendations

 Follow up as clinically indicated.

## 2019-02-22 ENCOUNTER — Encounter: Payer: Self-pay | Admitting: General Practice
